# Patient Record
Sex: Female | Born: 1945 | Race: White | Hispanic: No | Marital: Married | Smoking: Former smoker
Health system: Southern US, Community
[De-identification: ages and names within clinical notes are randomized; demographics above are authoritative.]

## PROBLEM LIST (undated history)

## (undated) DIAGNOSIS — R0602 Shortness of breath: Secondary | ICD-10-CM

## (undated) DIAGNOSIS — R42 Dizziness and giddiness: Secondary | ICD-10-CM

## (undated) DIAGNOSIS — H269 Unspecified cataract: Secondary | ICD-10-CM

## (undated) DIAGNOSIS — Q251 Coarctation of aorta: Secondary | ICD-10-CM

## (undated) DIAGNOSIS — L853 Xerosis cutis: Secondary | ICD-10-CM

## (undated) DIAGNOSIS — K5900 Constipation, unspecified: Secondary | ICD-10-CM

## (undated) DIAGNOSIS — F32A Depression, unspecified: Secondary | ICD-10-CM

## (undated) DIAGNOSIS — G8929 Other chronic pain: Secondary | ICD-10-CM

## (undated) DIAGNOSIS — M549 Dorsalgia, unspecified: Secondary | ICD-10-CM

## (undated) DIAGNOSIS — F419 Anxiety disorder, unspecified: Secondary | ICD-10-CM

## (undated) DIAGNOSIS — F329 Major depressive disorder, single episode, unspecified: Secondary | ICD-10-CM

## (undated) DIAGNOSIS — R51 Headache: Secondary | ICD-10-CM

## (undated) DIAGNOSIS — K219 Gastro-esophageal reflux disease without esophagitis: Secondary | ICD-10-CM

## (undated) DIAGNOSIS — M1990 Unspecified osteoarthritis, unspecified site: Secondary | ICD-10-CM

## (undated) DIAGNOSIS — J189 Pneumonia, unspecified organism: Secondary | ICD-10-CM

## (undated) DIAGNOSIS — Z8719 Personal history of other diseases of the digestive system: Secondary | ICD-10-CM

## (undated) DIAGNOSIS — Z8709 Personal history of other diseases of the respiratory system: Secondary | ICD-10-CM

## (undated) DIAGNOSIS — G4700 Insomnia, unspecified: Secondary | ICD-10-CM

## (undated) DIAGNOSIS — R32 Unspecified urinary incontinence: Secondary | ICD-10-CM

## (undated) DIAGNOSIS — R200 Anesthesia of skin: Secondary | ICD-10-CM

## (undated) HISTORY — PX: EYE SURGERY: SHX253

## (undated) HISTORY — DX: Gastro-esophageal reflux disease without esophagitis: K21.9

## (undated) HISTORY — DX: Unspecified osteoarthritis, unspecified site: M19.90

## (undated) HISTORY — DX: Coarctation of aorta: Q25.1

## (undated) HISTORY — PX: ESOPHAGOGASTRODUODENOSCOPY: SHX1529

## (undated) HISTORY — PX: CARPAL TUNNEL RELEASE: SHX101

## (undated) HISTORY — PX: OOPHORECTOMY: SHX86

## (undated) HISTORY — PX: ABDOMINAL HYSTERECTOMY: SHX81

## (undated) HISTORY — PX: HERNIA REPAIR: SHX51

## (undated) HISTORY — PX: APPENDECTOMY: SHX54

---

## 1999-04-16 HISTORY — PX: COLONOSCOPY: SHX174

## 2011-01-02 DIAGNOSIS — R609 Edema, unspecified: Secondary | ICD-10-CM

## 2012-03-16 DIAGNOSIS — M1990 Unspecified osteoarthritis, unspecified site: Secondary | ICD-10-CM

## 2012-03-16 HISTORY — DX: Unspecified osteoarthritis, unspecified site: M19.90

## 2012-04-21 ENCOUNTER — Other Ambulatory Visit: Payer: Self-pay | Admitting: *Deleted

## 2012-04-21 DIAGNOSIS — I350 Nonrheumatic aortic (valve) stenosis: Secondary | ICD-10-CM

## 2012-05-12 ENCOUNTER — Encounter: Payer: 59 | Admitting: Vascular Surgery

## 2012-05-12 ENCOUNTER — Encounter: Payer: Self-pay | Admitting: Vascular Surgery

## 2012-05-20 ENCOUNTER — Encounter: Payer: Self-pay | Admitting: Vascular Surgery

## 2012-05-21 ENCOUNTER — Ambulatory Visit (INDEPENDENT_AMBULATORY_CARE_PROVIDER_SITE_OTHER): Payer: 59 | Admitting: Vascular Surgery

## 2012-05-21 ENCOUNTER — Encounter (INDEPENDENT_AMBULATORY_CARE_PROVIDER_SITE_OTHER): Payer: 59 | Admitting: *Deleted

## 2012-05-21 ENCOUNTER — Encounter: Payer: Self-pay | Admitting: Vascular Surgery

## 2012-05-21 ENCOUNTER — Other Ambulatory Visit: Payer: 59

## 2012-05-21 VITALS — BP 121/68 | HR 79 | Ht 65.0 in | Wt 129.7 lb

## 2012-05-21 DIAGNOSIS — Q251 Coarctation of aorta: Secondary | ICD-10-CM

## 2012-05-21 DIAGNOSIS — Z01810 Encounter for preprocedural cardiovascular examination: Secondary | ICD-10-CM

## 2012-05-21 DIAGNOSIS — I350 Nonrheumatic aortic (valve) stenosis: Secondary | ICD-10-CM

## 2012-05-21 NOTE — Progress Notes (Signed)
[facility] [place] HISTORY AND PHYSICAL  [facility] Consultation  Referring physician: Rodney Mortensen MD History of Present Illness:  Patient is a 67 y.o. year old female who presents for evaluation of lower extremity claudication. She was seen by Dr. Mortensen recently for evaluation of leg weakness. She was found on MRI scan to have evidence of aortic stenosis in the abdominal portion. The patient states she has been having difficulty walking more than 50-100 feet for the last 6 months. She denies rest pain. She denies numbness or tingling in her feet. She has no history of hypertension elevated cholesterol or diabetes. She does smoke approximately 1/2 to one pack of cigarettes per day. Greater than 3 minutes they were spent regarding smoking cessation counseling. She is interested in quitting. She has had some dysphagia but states that she thinks this is related to her hiatal hernia and reflux. She denies food fear. She denies weight loss. She gets chest pain with exertion but again blames this on her reflux. She becomes short of breath climbing more than 2 flights of stairs. She states she did have a stress test in the past at [facility]. This is not available for review today. The MRI report is available the images are not available for viewing. She also complains of recent numbness and tingling in the digits of her left hand 69 and 5. This is intermittent in nature..  Other medical problems include reflux COPD and arthritis all of which are currently stable.  Past Medical History  Diagnosis Date  . Abdominal aortic stenosis   . Esophageal reflux   . COPD (chronic obstructive pulmonary disease)   . Arthritis 03/16/12    Minimal Degenerative Disc Disease  L3-4 and L 4-5    Past Surgical History  Procedure Date  . Abdominal hysterectomy   . Hernia repair   . Eye surgery      Social History History  Substance Use Topics  .  Smoking status: Current Every Day Smoker -- 0.5 packs/day    Types: Cigarettes  . Smokeless tobacco: Never Used     Comment: pt states she is using E-cigs   . Alcohol Use: No    Family History Family History  Problem Relation Age of Onset  . Heart attack Mother   . Diabetes Mother   . Heart disease Mother   . Hyperlipidemia Mother   . Hypertension Mother   . Lung disease Father     Black Lung  . Heart disease Father     before age 60  . Hyperlipidemia Father   . Hypertension Father   . Heart attack Father   . Diabetes Sister   . Heart disease Sister     before age 60  . Hyperlipidemia Sister   . Hypertension Sister   . Heart attack Sister   . Diabetes Brother   . Heart disease Brother     before age 60  . Hypertension Brother   . Heart attack Brother     Allergies  Allergies  Allergen Reactions  . Sulfa Antibiotics   . Sulfacet-Sulfur Wash &Cleanser   . Sulfacetamide Sodium-Sulfur   . Sulfacetamide-Prednisolone      Current Outpatient Prescriptions  Medication Sig Dispense Refill  . esomeprazole (NEXIUM) 40 MG capsule Take 40 mg by mouth daily before breakfast.      . HYDROcodone-acetaminophen (LORTAB 5) 5-500 MG per tablet Take 1 tablet by mouth every 6 (six) hours as needed.          ROS:   General:  No weight loss, Fever, chills  HEENT: No recent headaches, no nasal bleeding, no visual changes, no sore throat  Neurologic: No dizziness, blackouts, seizures. No recent symptoms of stroke or mini- stroke. No recent episodes of slurred speech, or temporary blindness.  Cardiac: + recent episodes of chest pain/pressure, no shortness of breath at rest.  + shortness of breath with exertion.  Denies history of atrial fibrillation or irregular heartbeat  Vascular: No history of rest pain in feet.  + history of claudication.  No history of non-healing ulcer, No history of DVT   Pulmonary: No home oxygen, no productive cough, no hemoptysis,  No asthma or  wheezing  Musculoskeletal:  [ ] Arthritis, [ x] Low back pain,  [ ] Joint pain  Hematologic:No history of hypercoagulable state.  No history of easy bleeding.  No history of anemia  Gastrointestinal: No hematochezia or melena,  + gastroesophageal reflux, + trouble swallowing  Urinary: [ ] chronic Kidney disease, [ ] on HD - [ ] MWF or [ ] TTHS, [ ] Burning with urination, [ ] Frequent urination, [ ] Difficulty urinating;   Skin: No rashes  Psychological: No history of anxiety,  No history of depression   Physical Examination  Filed Vitals:   05/21/12 0958  BP: 121/68  Pulse: 79  Height: 5' 5" (1.651 m)  Weight: 129 lb 11.2 oz (58.832 kg)  SpO2: 100%    Body mass index is 21.58 kg/(m^2).  General:  Alert and oriented, no acute distress HEENT: Normal Neck: No bruit or JVD Pulmonary: Clear to auscultation bilaterally Cardiac: Regular Rate and Rhythm without murmur Abdomen: Soft, non-tender, non-distended, no mass, lower midline scar, mild right upper quadrant tenderness on palpation Skin: No rash Extremity Pulses:  2+ radial, brachial, absent femoral, dorsalis pedis, posterior tibial pulses bilaterally Musculoskeletal: No deformity or edema  Neurologic: Upper and lower extremity motor 5/5 and symmetric  DATA: The patient had bilateral ABIs today which are reviewed and interpreted. ABI on the right was 0.54 left was 0.522 pressure 50 on the left 70 on the right waveforms were monophasic from the femoral to dorsalis pedis   ASSESSMENT:  Aortoiliac occlusive disease. Disabling claudication.  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 Possible coronary artery disease.   PLAN:1.  The patient was advised to quit smoking. #2 we have scheduled the patient for cardiology evaluation at [facility] possible stress test to evaluate her chest pain as well as the possibility that she may require aortobifemoral bypass grafting #3 CT angiogram of the abdomen and pelvis with lower  extremity runoff to further define her mesenteric circulation her aortoiliac circulation in her lower extremity runoff #4 she will followup in 2 weeks to review the results of the above testing #5 she was placed on one aspirin daily today  Charles Fields, MD [facility] [place] Office: [phone] Pager: [phone]  

## 2012-05-22 NOTE — Addendum Note (Signed)
Addended by: Sharee Pimple on: 05/22/2012 09:45 AM   Modules accepted: Orders

## 2012-05-25 ENCOUNTER — Encounter: Payer: Self-pay | Admitting: Internal Medicine

## 2012-05-25 ENCOUNTER — Ambulatory Visit (INDEPENDENT_AMBULATORY_CARE_PROVIDER_SITE_OTHER): Payer: 59 | Admitting: Internal Medicine

## 2012-05-25 VITALS — BP 138/66 | HR 76 | Ht 65.0 in | Wt 128.0 lb

## 2012-05-25 DIAGNOSIS — R079 Chest pain, unspecified: Secondary | ICD-10-CM

## 2012-05-25 NOTE — Patient Instructions (Addendum)
Your physician has requested that you have a lexiscan myoview. For further information please visit [email or web address] Please follow instruction sheet, as given.  Pt needs to sign records release to obtain records from [place], [facility].

## 2012-05-25 NOTE — Progress Notes (Signed)
HPI Patient is a 67 yo who is being evaluated for vascular surgery  Presents for preop eval.  Patient has had problem swallowing for a couple years Feels food gets stuck  Pain and SOB after eating    No pain in chest at other times Occasional PND if eats late  Uses inhaler. NOt too active because of pain in legs.   Does gardening otherwise no organized activity   Allergies  Allergen Reactions  . Sulfa Antibiotics   . Sulfacet-Sulfur Wash &Cleanser   . Sulfacetamide Sodium-Sulfur   . Sulfacetamide-Prednisolone     Current Outpatient Prescriptions  Medication Sig Dispense Refill  . esomeprazole (NEXIUM) 40 MG capsule Take 40 mg by mouth daily before breakfast.      . HYDROcodone-acetaminophen (LORTAB 5) 5-500 MG per tablet Take 1 tablet by mouth every 6 (six) hours as needed.       No current facility-administered medications for this visit.    Past Medical History  Diagnosis Date  . Abdominal aortic stenosis   . Esophageal reflux   . COPD (chronic obstructive pulmonary disease)   . Arthritis 03/16/12    Minimal Degenerative Disc Disease  L3-4 and L 4-5    Past Surgical History  Procedure Laterality Date  . Abdominal hysterectomy    . Hernia repair    . Eye surgery      Family History  Problem Relation Age of Onset  . Heart attack Mother   . Diabetes Mother   . Heart disease Mother   . Hyperlipidemia Mother   . Hypertension Mother   . Lung disease Father     Black Lung  . Heart disease Father     before age 64  . Hyperlipidemia Father   . Hypertension Father   . Heart attack Father   . Diabetes Sister   . Heart disease Sister     before age 32  . Hyperlipidemia Sister   . Hypertension Sister   . Heart attack Sister   . Diabetes Brother   . Heart disease Brother     before age 8  . Hypertension Brother   . Heart attack Brother     History   Social History  . Marital Status: Married    Spouse Name: N/A    Number of Children: N/A  . Years of  Education: N/A   Occupational History  . Not on file.   Social History Main Topics  . Smoking status: Current Every Day Smoker -- 0.50 packs/day    Types: Cigarettes  . Smokeless tobacco: Never Used     Comment: pt states she is using E-cigs   . Alcohol Use: No  . Drug Use: No  . Sexually Active: Not on file   Other Topics Concern  . Not on file   Social History Narrative  . No narrative on file    Review of Systems:  All systems reviewed.  They are negative to the above problem except as previously stated.  Vital Signs: BP 138/66  Pulse 76  Ht 5\' 5"  (1.651 m)  Wt 128 lb (58.06 kg)  BMI 21.3 kg/m2  SpO2 96%  Physical Exam [place] is in NAD HEENT:  Normocephalic, atraumatic. EOMI, PERRLA.  Neck: JVP is normal.  No bruits.  Lungs: clear to auscultation. No rales no wheezes.  Heart: Regular rate and rhythm. Normal S1, S2. No S3.   No significant murmurs. PMI not displaced.  Abdomen:  Supple, nontender. Normal bowel sounds. No masses. No  hepatomegaly.  Extremities:  No lower extremity edema.  Musculoskeletal :moving all extremities.  Neuro:   alert and oriented x3.  CN II-XII grossly intact.  EKG  SR 71 bpm.    Assessment and Plan:  1.  Preop eval.   Patient with PVOD  Comes for preop evaluation.  No CP but does have some SOB  Activity is limited.  I would recomm  lexiscan myoview to evaluate  2.  GI  Will get records of swallowing eval from [place].  3.  Lipids  Needs to be on a statin.  Will get fasting lipids.

## 2012-06-01 ENCOUNTER — Other Ambulatory Visit: Payer: Self-pay | Admitting: *Deleted

## 2012-06-01 DIAGNOSIS — E785 Hyperlipidemia, unspecified: Secondary | ICD-10-CM

## 2012-06-08 ENCOUNTER — Ambulatory Visit (HOSPITAL_COMMUNITY): Payer: Medicare PPO | Attending: Cardiovascular Disease | Admitting: Radiology

## 2012-06-08 ENCOUNTER — Other Ambulatory Visit (INDEPENDENT_AMBULATORY_CARE_PROVIDER_SITE_OTHER): Payer: 59

## 2012-06-08 VITALS — BP 121/39 | HR 77 | Ht 65.0 in | Wt 127.0 lb

## 2012-06-08 DIAGNOSIS — R0609 Other forms of dyspnea: Secondary | ICD-10-CM | POA: Insufficient documentation

## 2012-06-08 DIAGNOSIS — R079 Chest pain, unspecified: Secondary | ICD-10-CM

## 2012-06-08 DIAGNOSIS — R0789 Other chest pain: Secondary | ICD-10-CM | POA: Insufficient documentation

## 2012-06-08 DIAGNOSIS — E785 Hyperlipidemia, unspecified: Secondary | ICD-10-CM

## 2012-06-08 DIAGNOSIS — R209 Unspecified disturbances of skin sensation: Secondary | ICD-10-CM | POA: Insufficient documentation

## 2012-06-08 DIAGNOSIS — R42 Dizziness and giddiness: Secondary | ICD-10-CM | POA: Insufficient documentation

## 2012-06-08 DIAGNOSIS — Z8249 Family history of ischemic heart disease and other diseases of the circulatory system: Secondary | ICD-10-CM | POA: Insufficient documentation

## 2012-06-08 DIAGNOSIS — I739 Peripheral vascular disease, unspecified: Secondary | ICD-10-CM | POA: Insufficient documentation

## 2012-06-08 DIAGNOSIS — R0602 Shortness of breath: Secondary | ICD-10-CM

## 2012-06-08 DIAGNOSIS — R0989 Other specified symptoms and signs involving the circulatory and respiratory systems: Secondary | ICD-10-CM | POA: Insufficient documentation

## 2012-06-08 DIAGNOSIS — F17200 Nicotine dependence, unspecified, uncomplicated: Secondary | ICD-10-CM | POA: Insufficient documentation

## 2012-06-08 DIAGNOSIS — Z01810 Encounter for preprocedural cardiovascular examination: Secondary | ICD-10-CM

## 2012-06-08 LAB — LIPID PANEL
Cholesterol: 240 mg/dL — ABNORMAL HIGH (ref 0–200)
Total CHOL/HDL Ratio: 10

## 2012-06-08 LAB — LDL CHOLESTEROL, DIRECT: Direct LDL: 101.1 mg/dL

## 2012-06-08 MED ORDER — TECHNETIUM TC 99M SESTAMIBI GENERIC - CARDIOLITE
33.0000 | Freq: Once | INTRAVENOUS | Status: AC | PRN
  Administered 2012-06-08: 33 via INTRAVENOUS

## 2012-06-08 MED ORDER — REGADENOSON 0.4 MG/5ML IV SOLN
0.4000 mg | Freq: Once | INTRAVENOUS | Status: AC
  Administered 2012-06-08: 0.4 mg via INTRAVENOUS

## 2012-06-08 MED ORDER — TECHNETIUM TC 99M SESTAMIBI GENERIC - CARDIOLITE
11.0000 | Freq: Once | INTRAVENOUS | Status: AC | PRN
  Administered 2012-06-08: 11 via INTRAVENOUS

## 2012-06-08 NOTE — Progress Notes (Signed)
[facility] SITE 3 NUCLEAR MED [address] [place], [place] [address] [phone]    Cardiology Nuclear Med Study  Lindsay [name] is a 67 y.o. female     MRN : [id number]     DOB: 1946-01-23  Procedure Date: 06/08/2012  Nuclear Med Background Indication for Stress Test:  Evaluation for Ischemia and Pending Clearance for Vascular Aorto-Bifemoral Bypass by Dr Fabienne Bruns History:  '13 GXT:OK per patient Cardiac Risk Factors: Claudication, Family History - CAD, Lipids, PVD and Smoker  Symptoms:  Chest Pressure/Chest Tightness with and without Exertion (last episode of chest discomfort is now, 2/10), Dizziness and DOE.  She also c/o (L) Hand Numbness with resting BP readings of (L) 121/39 and (R) 112/50.   Nuclear Pre-Procedure Caffeine/Decaff Intake:  None > 12 hrs NPO After: 9:00pm   Lungs:  Clear. O2 Sat: 95% on room air. IV 0.9% NS with Angio Cath:  22g  IV Site: R Antecubital x 1, tolerated well IV Started by:  Irean Hong, RN  Chest Size (in):  36 Cup Size: C  Height: 5\' 5"  (1.651 m)  Weight:  127 lb (57.607 kg)  BMI:  Body mass index is 21.13 kg/(m^2). Tech Comments:  n/a    Nuclear Med Study 1 or 2 day study: 1 day  Stress Test Type:  Lexiscan  Reading MD: Charlton Haws, MD  Order Authorizing Provider:  Dietrich Pates, MD  Resting Radionuclide: Technetium 69m Sestamibi  Resting Radionuclide Dose: 11.0 mCi   Stress Radionuclide:  Technetium 65m Sestamibi  Stress Radionuclide Dose: 33.0 mCi           Stress Protocol Rest HR: 77 Stress HR: 112  Rest BP: (L) arm 120/39  (R) 112/50 Stress BP: 135/49  Exercise Time (min): n/a METS: n/a   Predicted Max HR: 152 bpm % Max HR: 73.68 bpm Rate Pressure Product: 41324   Dose of Adenosine (mg):  n/a Dose of Lexiscan: 0.4 mg  Dose of Atropine (mg): n/a Dose of Dobutamine: n/a mcg/kg/min (at max HR)  Stress Test Technologist: Smiley Houseman, CMA-N  Nuclear Technologist:  Domenic Polite, CNMT     Rest  Procedure:  Myocardial perfusion imaging was performed at rest 45 minutes following the intravenous administration of Technetium 29m Sestamibi.  Rest ECG: NSR - Normal EKG  Stress Procedure:  The patient received IV Lexiscan 0.4 mg over 15-seconds.  Technetium 24m Sestamibi injected at 30-seconds.  She did c/o chest pain, 8/10,  with Lexiscan.  Quantitative spect images were obtained after a 45 minute delay.  Stress ECG: No significant change from baseline ECG  QPS Raw Data Images:  Normal; no motion artifact; normal heart/lung ratio. Stress Images:  Normal homogeneous uptake in all areas of the myocardium. Rest Images:  Normal homogeneous uptake in all areas of the myocardium. Subtraction (SDS):  Normal Transient Ischemic Dilatation (Normal <1.22):  1.26 Lung/Heart Ratio (Normal <0.45):  0.33  Quantitative Gated Spect Images QGS EDV:  33 ml QGS ESV:  04 ml  Impression Exercise Capacity:  Lexiscan with low level exercise. BP Response:  Normal blood pressure response. Clinical Symptoms:  Atypical chest pain. ECG Impression:  No significant ST segment change suggestive of ischemia. Comparison with Prior Nuclear Study: No previous nuclear study performed  Overall Impression:  Normal stress nuclear study.  LV Ejection Fraction: 87%.  LV Wall Motion:  NL LV Function; NL Wall Motion   Charlton Haws

## 2012-06-10 ENCOUNTER — Encounter: Payer: Self-pay | Admitting: Vascular Surgery

## 2012-06-11 ENCOUNTER — Other Ambulatory Visit: Payer: Self-pay | Admitting: *Deleted

## 2012-06-11 ENCOUNTER — Encounter (HOSPITAL_COMMUNITY): Payer: Self-pay | Admitting: Pharmacy Technician

## 2012-06-11 ENCOUNTER — Encounter: Payer: Self-pay | Admitting: Vascular Surgery

## 2012-06-11 ENCOUNTER — Ambulatory Visit (INDEPENDENT_AMBULATORY_CARE_PROVIDER_SITE_OTHER): Payer: 59 | Admitting: Vascular Surgery

## 2012-06-11 ENCOUNTER — Encounter: Payer: Self-pay | Admitting: *Deleted

## 2012-06-11 ENCOUNTER — Ambulatory Visit
Admission: RE | Admit: 2012-06-11 | Discharge: 2012-06-11 | Disposition: A | Discharge status: Home / Self Care | Payer: 59 | Source: Ambulatory Visit | Attending: Vascular Surgery | Admitting: Vascular Surgery

## 2012-06-11 VITALS — BP 125/65 | HR 84 | Ht 65.0 in | Wt 129.4 lb

## 2012-06-11 DIAGNOSIS — Q251 Coarctation of aorta: Secondary | ICD-10-CM

## 2012-06-11 DIAGNOSIS — Z01810 Encounter for preprocedural cardiovascular examination: Secondary | ICD-10-CM

## 2012-06-11 MED ORDER — IOHEXOL 350 MG/ML SOLN
150.0000 mL | Freq: Once | INTRAVENOUS | Status: AC | PRN
  Administered 2012-06-11: 150 mL via INTRAVENOUS

## 2012-06-11 NOTE — Progress Notes (Signed)
[facility] [place] HISTORY AND PHYSICAL   History of Present Illness:  Patient is a 67 y.o. year old female who presents for evaluation of lower extremity claudication.  She was seen a few weeks ago and noted to have a clinical exam significant for aortoiliac occlusive disease. She was sent for a CT angiogram of the abdomen and pelvis. This was also done with runoff. She returns today for further followup. She has also seen Dr. Huston Foley and Dr. Eden Emms for cardiology evaluation. Her stress test showed no ischemia. Unfortunately she continues to smoke. Smoking cessation was again emphasized today. She continues to have bilateral lower extremity claudication symptoms at short walking distance.  Past Medical History  Diagnosis Date  . Abdominal aortic stenosis   . Esophageal reflux   . COPD (chronic obstructive pulmonary disease)   . Arthritis 03/16/12    Minimal Degenerative Disc Disease  L3-4 and L 4-5    Past Surgical History  Procedure Laterality Date  . Abdominal hysterectomy    . Hernia repair    . Eye surgery       Social History History  Substance Use Topics  . Smoking status: Current Every Day Smoker -- 0.50 packs/day    Types: Cigarettes  . Smokeless tobacco: Never Used     Comment: pt states she is using E-cigs   . Alcohol Use: No    Family History Family History  Problem Relation Age of Onset  . Heart attack Mother   . Diabetes Mother   . Heart disease Mother   . Hyperlipidemia Mother   . Hypertension Mother   . Lung disease Father     Black Lung  . Heart disease Father     before age 70  . Hyperlipidemia Father   . Hypertension Father   . Heart attack Father   . Diabetes Sister   . Heart disease Sister     before age 56  . Hyperlipidemia Sister   . Hypertension Sister   . Heart attack Sister   . Diabetes Brother   . Heart disease Brother     before age 30  . Hypertension Brother   . Heart attack Brother      Allergies  Allergies  Allergen Reactions  . Sulfa Antibiotics Anaphylaxis    Can not take ANYTHING with SULFA.  Maude Leriche Wash &Cleanser Anaphylaxis     Current Outpatient Prescriptions  Medication Sig Dispense Refill  . albuterol (PROVENTIL HFA;VENTOLIN HFA) 108 (90 BASE) MCG/ACT inhaler Inhale 2 puffs into the lungs every 6 (six) hours as needed for wheezing. For wheezing      . aspirin EC 81 MG tablet Take 81 mg by mouth daily.      Marland Kitchen esomeprazole (NEXIUM) 40 MG capsule Take 40 mg by mouth daily before breakfast.      . HYDROcodone-acetaminophen (NORCO/VICODIN) 5-325 MG per tablet Take 1 tablet by mouth every 6 (six) hours as needed for pain. For pain       No current facility-administered medications for this visit.    ROS:   General:  No weight loss, Fever, chills   Cardiac: No recent episodes of chest pain/pressure, no shortness of breath at rest.  +shortness of breath with exertion.  Denies history of atrial fibrillation or irregular heartbeat  Vascular: No history of rest pain in feet.  + history of claudication.  No history of non-healing ulcer, No history of DVT   Pulmonary: No home oxygen, no productive cough,  no hemoptysis,  No asthma or wheezing   Physical Examinatio Body mass index is 21.53 kg/(m^2).  Filed Vitals:   06/11/12 1325  BP: 125/65  Pulse: 84  Height: 5\' 5"  (1.651 m)  Weight: 129 lb 6.4 oz (58.695 kg)  SpO2: 96%    General:  Alert and oriented, no acute distress HEENT: Normal Neck: No bruit or JVD Pulmonary: Clear to auscultation bilaterally Cardiac: Regular Rate and Rhythm without murmur Abdomen: Soft, non-tender, non-distended, no mass, no scars Skin: No rash Extremity Pulses:  2+ radial, brachial, absent femoral, dorsalis pedis, posterior tibial pulses bilaterally Musculoskeletal: No deformity or edema  Neurologic: Upper and lower extremity motor 5/5 and symmetric  DATA: CT angiogram the abdomen and pelvis is reviewed  today. This shows a significant plaque burden in the infrarenal abdominal aorta. The infrarenal abdominal aorta is up to 70% stenosis. The common external and internal iliac arteries are patent but small. There approximate 5 mm. There is three-vessel runoff with patent superficial femoral and popliteal arteries bilaterally. There is also some thrombus lining the. Visceral aorta. However, there is no compromise of the celiac or superior mesenteric arteries. There is an 8 cm cystic ovarian mass.   ASSESSMENT: The patient needs aortic reconstruction with either aortic endarterectomy or possibly replacement of her  infrarenal aorta either with aortoiliac or aortobifemoral bypass. However, in light of the ovarian mass I have also consulted Dr Chiquita Loth with Gynecology.   PLAN:  Pelvic ultrasound tomorrow per Dr. Audie Box. Aortic reconstruction with possible combined oophorectomy depending on Dr. Reynold Bowen findings.  Tentatively her aortic reconstruction would be Wednesday, March 5.  All these findings were communicated with the patient today by phone.  Fabienne Bruns, MD [facility] [place] Office: [phone] Pager: [phone]

## 2012-06-12 ENCOUNTER — Encounter: Payer: Self-pay | Admitting: Gynecology

## 2012-06-12 ENCOUNTER — Ambulatory Visit (INDEPENDENT_AMBULATORY_CARE_PROVIDER_SITE_OTHER): Payer: 59 | Admitting: Gynecology

## 2012-06-12 ENCOUNTER — Ambulatory Visit (INDEPENDENT_AMBULATORY_CARE_PROVIDER_SITE_OTHER): Payer: 59

## 2012-06-12 VITALS — BP 120/76 | Ht 63.0 in | Wt 128.0 lb

## 2012-06-12 DIAGNOSIS — R1032 Left lower quadrant pain: Secondary | ICD-10-CM

## 2012-06-12 DIAGNOSIS — D4959 Neoplasm of unspecified behavior of other genitourinary organ: Secondary | ICD-10-CM

## 2012-06-12 DIAGNOSIS — N83209 Unspecified ovarian cyst, unspecified side: Secondary | ICD-10-CM

## 2012-06-12 LAB — CA 125: CA 125: 23.7 U/mL (ref 0.0–30.2)

## 2012-06-12 NOTE — Progress Notes (Addendum)
Patient presents as a consult from Dr. Fabienne Bruns.  She is scheduled for aortic reconstruction next week due to intermittent claudication where preoperative CT scan yesterday showed an 8 cm cystic mass in the left adnexa. Patient has a history of TAH RSO due to bleeding and right ovarian cyst a number of years ago. She does note some left-sided pain over the last several weeks but no other symptoms. Has not seen a gynecologist in a number of years.  Exam with Kim Asst. Abdomen soft nontender without masses guarding rebound organomegaly. Pelvic external BUS vagina with atrophic changes. Cuff well supported. Bimanual difficult due to voluntary guarding with no gross palpable masses. Rectovaginal exam is normal.  Ultrasound today shows right adnexa negative. Left adnexa with echo free thin-walled avascular cyst measuring 81 x 54 x 61 mm. No cul-de-sac/ascites fluid noted.  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 CT shows no evidence of adenopathy or ascites. We'll check baseline CA 125. I suspect that this is a benign cyst that she may have had for some time. Discussed with patient, I think if she was not having surgery already options for expectant management with reultrasound in several months reasonable. As she is having a large midline incision for her vascular surgery certainly reasonable to remove this cyst at the same time as I do not think this would add significant risk to her surgery. Options for gynecologic oncology referral discussed and offered, but given the 6.5 cm size, simple avascular and if she does have a normal CA 125 highly unlikely that it is cancer. If it does turned out to be cancer with final pathology the possibilities for further treatment and the issue of suboptimal staging discussed. We'll await CA 125 results and then talk to Dr. Darrick Penna about coordinating the surgery.

## 2012-06-12 NOTE — Patient Instructions (Signed)
Office we'll followup with you in reference to the blood work and surgery.

## 2012-06-15 ENCOUNTER — Telehealth: Payer: Self-pay | Admitting: Gynecology

## 2012-06-15 NOTE — Telephone Encounter (Signed)
Called patient in followup of her CA 125 which returned normal at 78.  I reviewed the situation again with her. She has a thin-walled avascular 6.5 cm mean left ovarian cyst discovered incidentally while doing CT scan for her vascular workup. If she was not having surgery already I would have recommended followup with reultrasound in several months as I suspect that this is a benign process. As she is having surgery with a large incision I think it would be prudent to remove it now instead of waiting and then discovering that it's enlarging months from now and requiring reoperation. She understands that I cannot guarantee that this is not ovarian cancer and the options for gynecologic oncology involvement and possibly having them do the surgery in conjunction with Dr. Darrick Penna was also discussed and offered. Given the timing this would require her to cancel her vascular surgery Wednesday to allow for them to see her. If I proceed with surgery and indeed it turns out to be an ovarian cancer I again reviewed the issues that she would not have complete staging that she would require followup with the oncologist and possible/probable treatment afterwards. The patient wants me to proceed with surgery accepting the possibility that it could be a cancer requiring referral to a gynecologic oncologist afterwards.  She understands that my portion of the surgery is small compared to the vascular portion. Dr. Darrick Penna will be in charge of her postoperative care and she will remain on his service. I will followup with her with the pathology results but would not be involved with the day-to-day postoperative care and she understands and accepts this.  I counseled her as to the risks of surgery in general noting Dr. Darrick Penna has already reviewed the risks.  I discussed the risks of infection, prolonged antibiotics, abscess formation requiring reoperation, hemorrhage necessitating transfusion and the risks of transfusion to include  transfusion reaction, hepatitis, HIV, Mad Cow disease and other unknown entities. Incisional complications requiring opening and draining of incisions, closure by secondary intention and long-term risks to include hernia formation/cosmetics was reviewed. The risk of damage to internal organs including bowel, bladder, ureters, vessels and nerves requiring major exploratory reparative surgeries and future reparative surgeries including bowel resection, bladder repair, ureteral damage repair and ostomy formation was also discussed with her. The patient's questions were answered to her satisfaction and she is ready to proceed with surgery from the gynecologic standpoint.  I subsequently discussed this with Dr. Darrick Penna on the phone and he will proceed with surgery as planned. He will open the patient and alert me when ready and he and I will perform the oophorectomy. I will remain available if there are questions postoperatively and will followup with the patient in reference to the ovarian pathology but will not be involved with her day to day care and he agrees and is comfortable with this.

## 2012-06-15 NOTE — Pre-Procedure Instructions (Signed)
Lindsay [name]  06/15/2012   Your procedure is scheduled on:  Wed, Mar 5 @ 8:30 AM  Report to [facility] at 6:30 AM.  Call this number if you have problems the morning of surgery: [phone]   Remember:   Do not eat food or drink liquids after midnight.   Take these medicines the morning of surgery with A SIP OF WATER: Albuterol<Bring Your Inhaler With You>,Pain Pill(if needed),and Esomeprazole(Nexium)   Do not wear jewelry, make-up or nail polish.  Do not wear lotions, powders, or perfumes. You may wear deodorant.  Do not shave 48 hours prior to surgery.   Do not bring valuables to the hospital.  Contacts, dentures or bridgework may not be worn into surgery.  Leave suitcase in the car. After surgery it may be brought to your room.  For patients admitted to the hospital, checkout time is 11:00 AM the day of  discharge.   Patients discharged the day of surgery will not be allowed to drive  home.    Special Instructions: Shower using CHG 2 nights before surgery and the night before surgery.  If you shower the day of surgery use CHG.  Use special wash - you have one bottle of CHG for all showers.  You should use approximately 1/3 of the bottle for each shower.   Please read over the following fact sheets that you were given: Pain Booklet, Coughing and Deep Breathing, Blood Transfusion Information, MRSA Information and Surgical Site Infection Prevention

## 2012-06-16 ENCOUNTER — Encounter (HOSPITAL_COMMUNITY)
Admission: RE | Admit: 2012-06-16 | Discharge: 2012-06-16 | Disposition: A | Discharge status: Home / Self Care | Payer: Medicare PPO | Source: Ambulatory Visit | Attending: Anesthesiology | Admitting: Anesthesiology

## 2012-06-16 ENCOUNTER — Encounter (HOSPITAL_COMMUNITY): Payer: Self-pay

## 2012-06-16 ENCOUNTER — Encounter (HOSPITAL_COMMUNITY)
Admission: RE | Admit: 2012-06-16 | Discharge: 2012-06-16 | Disposition: A | Discharge status: Home / Self Care | Payer: Medicare PPO | Source: Ambulatory Visit | Attending: Vascular Surgery | Admitting: Vascular Surgery

## 2012-06-16 HISTORY — DX: Constipation, unspecified: K59.00

## 2012-06-16 HISTORY — DX: Anesthesia of skin: R20.0

## 2012-06-16 HISTORY — DX: Pneumonia, unspecified organism: J18.9

## 2012-06-16 HISTORY — DX: Dorsalgia, unspecified: M54.9

## 2012-06-16 HISTORY — DX: Headache: R51

## 2012-06-16 HISTORY — DX: Shortness of breath: R06.02

## 2012-06-16 HISTORY — DX: Anxiety disorder, unspecified: F41.9

## 2012-06-16 HISTORY — DX: Personal history of other diseases of the digestive system: Z87.19

## 2012-06-16 HISTORY — DX: Insomnia, unspecified: G47.00

## 2012-06-16 HISTORY — DX: Xerosis cutis: L85.3

## 2012-06-16 HISTORY — DX: Unspecified urinary incontinence: R32

## 2012-06-16 HISTORY — DX: Depression, unspecified: F32.A

## 2012-06-16 HISTORY — DX: Dizziness and giddiness: R42

## 2012-06-16 HISTORY — DX: Personal history of other diseases of the respiratory system: Z87.09

## 2012-06-16 HISTORY — DX: Unspecified cataract: H26.9

## 2012-06-16 HISTORY — DX: Major depressive disorder, single episode, unspecified: F32.9

## 2012-06-16 HISTORY — DX: Other chronic pain: G89.29

## 2012-06-16 LAB — PROTIME-INR
INR: 0.85 (ref 0.00–1.49)
Prothrombin Time: 11.6 seconds (ref 11.6–15.2)

## 2012-06-16 LAB — BLOOD GAS, ARTERIAL
Acid-base deficit: 2.6 mmol/L — ABNORMAL HIGH (ref 0.0–2.0)
Bicarbonate: 21.7 mEq/L (ref 20.0–24.0)
TCO2: 22.9 mmol/L (ref 0–100)
pCO2 arterial: 37.8 mmHg (ref 35.0–45.0)

## 2012-06-16 LAB — COMPREHENSIVE METABOLIC PANEL
Alkaline Phosphatase: 76 U/L (ref 39–117)
BUN: 9 mg/dL (ref 6–23)
CO2: 20 mEq/L (ref 19–32)
Chloride: 104 mEq/L (ref 96–112)
Creatinine, Ser: 0.69 mg/dL (ref 0.50–1.10)
GFR calc Af Amer: >90 mL/min (ref >90)
GFR calc non Af Amer: 87 mL/min — ABNORMAL LOW (ref >90)
Glucose, Bld: 98 mg/dL (ref 70–99)
Total Bilirubin: 0.2 mg/dL — ABNORMAL LOW (ref 0.3–1.2)

## 2012-06-16 LAB — URINALYSIS, ROUTINE W REFLEX MICROSCOPIC
Hgb urine dipstick: NEGATIVE
Leukocytes, UA: NEGATIVE
Protein, ur: NEGATIVE mg/dL
Specific Gravity, Urine: 1.01 (ref 1.005–1.030)
Urobilinogen, UA: 0.2 mg/dL (ref 0.0–1.0)

## 2012-06-16 LAB — CBC
HCT: 41.9 % (ref 36.0–46.0)
Hemoglobin: 14.6 g/dL (ref 12.0–15.0)
MCV: 82.5 fL (ref 78.0–100.0)
RBC: 5.08 MIL/uL (ref 3.87–5.11)
RDW: 14.1 % (ref 11.5–15.5)
WBC: 10.7 10*3/uL — ABNORMAL HIGH (ref 4.0–10.5)

## 2012-06-16 LAB — SURGICAL PCR SCREEN: Staphylococcus aureus: NEGATIVE

## 2012-06-16 LAB — ABO/RH: ABO/RH(D): A POS

## 2012-06-16 MED ORDER — DEXTROSE 5 % IV SOLN
1.5000 g | INTRAVENOUS | Status: AC
  Administered 2012-06-17: 1.5 g via INTRAVENOUS
  Filled 2012-06-16: qty 1.5

## 2012-06-16 NOTE — Addendum Note (Signed)
Addended by: Dara Lords on: 06/16/2012 10:40 AM   Modules accepted: Orders

## 2012-06-16 NOTE — H&P (Signed)
Lindsay [name] Nov 12, 1945 [id number]   History and Physical  Chief complaint: Ovarian cyst  History of present illness: 67 y.o. G2P2002 patient presented as a consult from Dr. Fabienne Bruns. She is scheduled for aortic reconstruction due to intermittent claudication where preoperative CT scan showed an 8 cm cystic mass in the left adnexa. No evidence of lymphadenopathy or ascites.   Followup ultrasound shows a left adnexa with echo free thin-walled avascular cyst measuring 81 x 54 x 61 mm. No cul-de-sac/ascites fluid noted.  Right adnexa negative.   CA 125 23. Patient has a history of TAH RSO due to bleeding and right ovarian cyst a number of years ago. Has not seen a gynecologist in a number of years.   Past medical history,surgical history, medications, allergies, family history and social history were all reviewed and documented in the EPIC chart. ROS:  Was performed and pertinent positives and negatives are included in the history of present illness.  Exam:  Directed to her GYN complaint  General: well developed, well nourished female, no acute distress Abdomen soft nontender without masses guarding rebound organomegaly.  Pelvic external BUS vagina with atrophic changes. [name] well supported. Bimanual difficult due to voluntary guarding with no gross palpable masses. Rectovaginal exam is normal. Minimal or abnormal   Assessment/Plan:  67 y.o. G2P2002 with a thin-walled avascular 6.5 cm mean left ovarian cyst discovered incidentally while doing CT scan for her vascular workup with a negative CA 125. If she was not having surgery already I would have recommended followup with reultrasound in several months as I suspect that this is a benign process. As she is having surgery with a large incision I think it would be prudent to remove it now instead of waiting and then discovering that it's enlarging months from now and requiring reoperation. She understands that I cannot guarantee that this is  not ovarian cancer and the options for gynecologic oncology involvement and possibly having them do the surgery in conjunction with Dr. Darrick Penna was also discussed and offered.  If I proceed with surgery and indeed it turns out to be an ovarian cancer I again reviewed the issues that she would not have complete staging that she would require followup with the oncologist and possible/probable treatment afterwards. The patient wants me to proceed with surgery accepting the possibility that it could be a cancer requiring referral to a gynecologic oncologist afterwards.  She understands that my portion of the surgery is small compared to the vascular portion and that Dr. Darrick Penna will be in charge of her postoperative care and she will remain on his service. I will followup with her with the pathology results but would not be involved with the day-to-day postoperative care and she understands and accepts this.  I counseled her as to the risks of surgery in general noting Dr. Darrick Penna has already reviewed the risks. I discussed the risks of infection, prolonged antibiotics, abscess formation requiring reoperation, hemorrhage necessitating transfusion and the risks of transfusion to include transfusion reaction, hepatitis, HIV, Mad Cow disease and other unknown entities. Incisional complications requiring opening and draining of incisions, closure by secondary intention and long-term risks to include hernia formation/cosmetics was reviewed. The risk of damage to internal organs including bowel, bladder, ureters, vessels and nerves requiring major exploratory reparative surgeries and future reparative surgeries including bowel resection, bladder repair, ureteral damage repair and ostomy formation was also discussed with her. The patient's questions were answered to her satisfaction and she is ready to proceed with  surgery from the gynecologic standpoint.     Dara Lords MD, 10:42 AM  06/16/2012

## 2012-06-16 NOTE — Progress Notes (Signed)
[name] notified of CXR results and will not hault surgery;spoke with [name] at Dr.Fields office who is going to have him review results

## 2012-06-16 NOTE — Progress Notes (Signed)
Pt doesn't have a cardiologist  Stress test report in epic from 06-08-12  Denies ever having an echo or heart cath  Dr.Chan Park in [place] is Medical MD  EKG report in epic from 05-25-12  Denies CXR being done within past  yr

## 2012-06-17 ENCOUNTER — Encounter (HOSPITAL_COMMUNITY): Payer: Self-pay | Admitting: Anesthesiology

## 2012-06-17 ENCOUNTER — Inpatient Hospital Stay (HOSPITAL_COMMUNITY)
Admission: RE | Admit: 2012-06-17 | Discharge: 2012-07-01 | DRG: 237 | Disposition: A | Discharge status: Home Health | Payer: Medicare PPO | Source: Ambulatory Visit | Attending: Vascular Surgery | Admitting: Vascular Surgery

## 2012-06-17 ENCOUNTER — Encounter (HOSPITAL_COMMUNITY)
Admission: RE | Disposition: A | Discharge status: Home Health | Payer: Self-pay | Source: Ambulatory Visit | Attending: Vascular Surgery

## 2012-06-17 ENCOUNTER — Inpatient Hospital Stay (HOSPITAL_COMMUNITY): Payer: Medicare PPO | Admitting: Anesthesiology

## 2012-06-17 ENCOUNTER — Inpatient Hospital Stay (HOSPITAL_COMMUNITY): Payer: Medicare PPO

## 2012-06-17 DIAGNOSIS — M51379 Other intervertebral disc degeneration, lumbosacral region without mention of lumbar back pain or lower extremity pain: Secondary | ICD-10-CM | POA: Diagnosis present

## 2012-06-17 DIAGNOSIS — E278 Other specified disorders of adrenal gland: Secondary | ICD-10-CM

## 2012-06-17 DIAGNOSIS — F17200 Nicotine dependence, unspecified, uncomplicated: Secondary | ICD-10-CM | POA: Diagnosis present

## 2012-06-17 DIAGNOSIS — N83209 Unspecified ovarian cyst, unspecified side: Secondary | ICD-10-CM | POA: Diagnosis present

## 2012-06-17 DIAGNOSIS — K219 Gastro-esophageal reflux disease without esophagitis: Secondary | ICD-10-CM | POA: Diagnosis present

## 2012-06-17 DIAGNOSIS — Y832 Surgical operation with anastomosis, bypass or graft as the cause of abnormal reaction of the patient, or of later complication, without mention of misadventure at the time of the procedure: Secondary | ICD-10-CM | POA: Diagnosis not present

## 2012-06-17 DIAGNOSIS — K449 Diaphragmatic hernia without obstruction or gangrene: Secondary | ICD-10-CM | POA: Diagnosis present

## 2012-06-17 DIAGNOSIS — D62 Acute posthemorrhagic anemia: Secondary | ICD-10-CM | POA: Diagnosis not present

## 2012-06-17 DIAGNOSIS — M129 Arthropathy, unspecified: Secondary | ICD-10-CM | POA: Diagnosis present

## 2012-06-17 DIAGNOSIS — I700 Atherosclerosis of aorta: Principal | ICD-10-CM | POA: Diagnosis present

## 2012-06-17 DIAGNOSIS — M5137 Other intervertebral disc degeneration, lumbosacral region: Secondary | ICD-10-CM | POA: Diagnosis present

## 2012-06-17 DIAGNOSIS — R209 Unspecified disturbances of skin sensation: Secondary | ICD-10-CM | POA: Diagnosis present

## 2012-06-17 DIAGNOSIS — E872 Acidosis, unspecified: Secondary | ICD-10-CM | POA: Diagnosis not present

## 2012-06-17 DIAGNOSIS — J95821 Acute postprocedural respiratory failure: Secondary | ICD-10-CM | POA: Diagnosis not present

## 2012-06-17 DIAGNOSIS — Q2529 Other atresia of aorta: Secondary | ICD-10-CM

## 2012-06-17 DIAGNOSIS — E46 Unspecified protein-calorie malnutrition: Secondary | ICD-10-CM | POA: Diagnosis not present

## 2012-06-17 DIAGNOSIS — Q251 Coarctation of aorta: Secondary | ICD-10-CM

## 2012-06-17 DIAGNOSIS — I70219 Atherosclerosis of native arteries of extremities with intermittent claudication, unspecified extremity: Secondary | ICD-10-CM

## 2012-06-17 DIAGNOSIS — K560 Paralytic ileus: Secondary | ICD-10-CM | POA: Diagnosis not present

## 2012-06-17 DIAGNOSIS — K929 Disease of digestive system, unspecified: Secondary | ICD-10-CM | POA: Diagnosis not present

## 2012-06-17 DIAGNOSIS — F329 Major depressive disorder, single episode, unspecified: Secondary | ICD-10-CM | POA: Diagnosis present

## 2012-06-17 DIAGNOSIS — F411 Generalized anxiety disorder: Secondary | ICD-10-CM | POA: Diagnosis present

## 2012-06-17 DIAGNOSIS — J4489 Other specified chronic obstructive pulmonary disease: Secondary | ICD-10-CM | POA: Diagnosis present

## 2012-06-17 DIAGNOSIS — F3289 Other specified depressive episodes: Secondary | ICD-10-CM | POA: Diagnosis present

## 2012-06-17 DIAGNOSIS — I9589 Other hypotension: Secondary | ICD-10-CM | POA: Diagnosis not present

## 2012-06-17 DIAGNOSIS — I708 Atherosclerosis of other arteries: Secondary | ICD-10-CM | POA: Diagnosis present

## 2012-06-17 DIAGNOSIS — N9489 Other specified conditions associated with female genital organs and menstrual cycle: Secondary | ICD-10-CM

## 2012-06-17 DIAGNOSIS — R1310 Dysphagia, unspecified: Secondary | ICD-10-CM | POA: Diagnosis present

## 2012-06-17 DIAGNOSIS — Z720 Tobacco use: Secondary | ICD-10-CM

## 2012-06-17 DIAGNOSIS — R110 Nausea: Secondary | ICD-10-CM | POA: Diagnosis not present

## 2012-06-17 DIAGNOSIS — E279 Disorder of adrenal gland, unspecified: Secondary | ICD-10-CM

## 2012-06-17 DIAGNOSIS — J9600 Acute respiratory failure, unspecified whether with hypoxia or hypercapnia: Secondary | ICD-10-CM | POA: Diagnosis not present

## 2012-06-17 DIAGNOSIS — I959 Hypotension, unspecified: Secondary | ICD-10-CM

## 2012-06-17 DIAGNOSIS — J449 Chronic obstructive pulmonary disease, unspecified: Secondary | ICD-10-CM | POA: Diagnosis present

## 2012-06-17 DIAGNOSIS — K5900 Constipation, unspecified: Secondary | ICD-10-CM | POA: Diagnosis not present

## 2012-06-17 HISTORY — PX: AORTIC ENDARTERECETOMY: SHX5724

## 2012-06-17 LAB — COMPREHENSIVE METABOLIC PANEL
Alkaline Phosphatase: 32 U/L — ABNORMAL LOW (ref 39–117)
BUN: 11 mg/dL (ref 6–23)
GFR calc Af Amer: 81 mL/min — ABNORMAL LOW (ref >90)
Glucose, Bld: 229 mg/dL — ABNORMAL HIGH (ref 70–99)
Potassium: 4.2 mEq/L (ref 3.5–5.1)
Total Protein: 4.1 g/dL — ABNORMAL LOW (ref 6.0–8.3)

## 2012-06-17 LAB — POCT I-STAT 3, ART BLOOD GAS (G3+)
Bicarbonate: 18.7 mEq/L — ABNORMAL LOW (ref 20.0–24.0)
Bicarbonate: 19.1 mEq/L — ABNORMAL LOW (ref 20.0–24.0)
O2 Saturation: 96 %
Patient temperature: 98
Patient temperature: 98.2
TCO2: 20 mmol/L (ref 0–100)
TCO2: 20 mmol/L (ref 0–100)
pCO2 arterial: 35.6 mmHg (ref 35.0–45.0)
pCO2 arterial: 44.9 mmHg (ref 35.0–45.0)
pH, Arterial: 7.226 — ABNORMAL LOW (ref 7.350–7.450)
pH, Arterial: 7.335 — ABNORMAL LOW (ref 7.350–7.450)

## 2012-06-17 LAB — CBC
HCT: 39.9 % (ref 36.0–46.0)
Hemoglobin: 13.7 g/dL (ref 12.0–15.0)
MCH: 28.8 pg (ref 26.0–34.0)
MCHC: 34.3 g/dL (ref 30.0–36.0)
MCV: 84 fL (ref 78.0–100.0)
Platelets: 113 10*3/uL — ABNORMAL LOW (ref 150–400)
RBC: 4.33 MIL/uL (ref 3.87–5.11)
RDW: 14.3 % (ref 11.5–15.5)
WBC: 20.7 10*3/uL — ABNORMAL HIGH (ref 4.0–10.5)

## 2012-06-17 LAB — POCT I-STAT 4, (NA,K, GLUC, HGB,HCT)
Glucose, Bld: 166 mg/dL — ABNORMAL HIGH (ref 70–99)
Hemoglobin: 9.2 g/dL — ABNORMAL LOW (ref 12.0–15.0)
Potassium: 4.3 mEq/L (ref 3.5–5.1)
Potassium: 4.2 mEq/L (ref 3.5–5.1)
Sodium: 141 mEq/L (ref 135–145)

## 2012-06-17 LAB — POCT I-STAT 7, (LYTES, BLD GAS, ICA,H+H)
Acid-base deficit: 1 mmol/L (ref 0.0–2.0)
Acid-base deficit: 6 mmol/L — ABNORMAL HIGH (ref 0.0–2.0)
Bicarbonate: 23.3 mEq/L (ref 20.0–24.0)
Bicarbonate: 21.3 mEq/L (ref 20.0–24.0)
Bicarbonate: 24.5 mEq/L — ABNORMAL HIGH (ref 20.0–24.0)
Calcium, Ion: 0.96 mmol/L — ABNORMAL LOW (ref 1.13–1.30)
HCT: 21 % — ABNORMAL LOW (ref 36.0–46.0)
HCT: 31 % — ABNORMAL LOW (ref 36.0–46.0)
Hemoglobin: 7.1 g/dL — ABNORMAL LOW (ref 12.0–15.0)
Hemoglobin: 11.2 g/dL — ABNORMAL LOW (ref 12.0–15.0)
O2 Saturation: 100 %
O2 Saturation: 100 %
Potassium: 5.6 mEq/L — ABNORMAL HIGH (ref 3.5–5.1)
Sodium: 140 mEq/L (ref 135–145)
Sodium: 139 mEq/L (ref 135–145)
TCO2: 25 mmol/L (ref 0–100)
TCO2: 23 mmol/L (ref 0–100)
pCO2 arterial: 48.1 mmHg — ABNORMAL HIGH (ref 35.0–45.0)
pCO2 arterial: 41.6 mmHg (ref 35.0–45.0)
pH, Arterial: 7.292 — ABNORMAL LOW (ref 7.350–7.450)
pO2, Arterial: 514 mmHg — ABNORMAL HIGH (ref 80.0–100.0)
pO2, Arterial: 543 mmHg — ABNORMAL HIGH (ref 80.0–100.0)

## 2012-06-17 LAB — BLOOD GAS, ARTERIAL
Acid-base deficit: 7.5 mmol/L — ABNORMAL HIGH (ref 0.0–2.0)
Bicarbonate: 18.6 mEq/L — ABNORMAL LOW (ref 20.0–24.0)
FIO2: 1 %
O2 Saturation: 99.8 %
TCO2: 19.9 mmol/L (ref 0–100)
pO2, Arterial: 309 mmHg — ABNORMAL HIGH (ref 80.0–100.0)

## 2012-06-17 LAB — MAGNESIUM: Magnesium: 1.2 mg/dL — ABNORMAL LOW (ref 1.5–2.5)

## 2012-06-17 LAB — BASIC METABOLIC PANEL
CO2: 19 mEq/L (ref 19–32)
Chloride: 113 mEq/L — ABNORMAL HIGH (ref 96–112)
GFR calc Af Amer: >90 mL/min (ref >90)
Potassium: 5 mEq/L (ref 3.5–5.1)

## 2012-06-17 LAB — PROTIME-INR
INR: 1.49 (ref 0.00–1.49)
Prothrombin Time: 17.6 seconds — ABNORMAL HIGH (ref 11.6–15.2)

## 2012-06-17 LAB — APTT: aPTT: 38 seconds — ABNORMAL HIGH (ref 24–37)

## 2012-06-17 SURGERY — OOPHORECTOMY
Anesthesia: General | Laterality: Left

## 2012-06-17 SURGERY — CREATION, BYPASS, ARTERIAL, AORTA TO ILIAC, USING GRAFT
Anesthesia: General | Wound class: Clean

## 2012-06-17 MED ORDER — ARTIFICIAL TEARS OP OINT
TOPICAL_OINTMENT | OPHTHALMIC | Status: DC | PRN
  Administered 2012-06-17: 1 via OPHTHALMIC

## 2012-06-17 MED ORDER — LIDOCAINE HCL (CARDIAC) 20 MG/ML IV SOLN
INTRAVENOUS | Status: DC | PRN
  Administered 2012-06-17: 80 mg via INTRAVENOUS

## 2012-06-17 MED ORDER — MAGNESIUM SULFATE 40 MG/ML IJ SOLN
2.0000 g | Freq: Once | INTRAMUSCULAR | Status: AC
  Administered 2012-06-17: 2 g via INTRAVENOUS
  Filled 2012-06-17: qty 50

## 2012-06-17 MED ORDER — MORPHINE SULFATE 2 MG/ML IJ SOLN
2.0000 mg | INTRAMUSCULAR | Status: DC | PRN
  Administered 2012-06-17 (×3): 2 mg via INTRAVENOUS
  Administered 2012-06-17: 4 mg via INTRAVENOUS
  Administered 2012-06-17 (×2): 2 mg via INTRAVENOUS
  Administered 2012-06-18: 4 mg via INTRAVENOUS
  Administered 2012-06-18: 2 mg via INTRAVENOUS
  Administered 2012-06-18: 1 mg via INTRAVENOUS
  Filled 2012-06-17: qty 1
  Filled 2012-06-17: qty 2
  Filled 2012-06-17: qty 1
  Filled 2012-06-17: qty 2
  Filled 2012-06-17 (×3): qty 1
  Filled 2012-06-17: qty 2

## 2012-06-17 MED ORDER — SODIUM CHLORIDE 0.9 % IV SOLN
INTRAVENOUS | Status: DC | PRN
  Administered 2012-06-17: 11:00:00 via INTRAVENOUS

## 2012-06-17 MED ORDER — HYDROMORPHONE HCL PF 1 MG/ML IJ SOLN: 0.2500 mg | INTRAMUSCULAR | Status: DC | PRN

## 2012-06-17 MED ORDER — KCL IN DEXTROSE-NACL 20-5-0.9 MEQ/L-%-% IV SOLN
INTRAVENOUS | Status: DC
  Administered 2012-06-17: 17:00:00 via INTRAVENOUS
  Filled 2012-06-17 (×3): qty 1000

## 2012-06-17 MED ORDER — HYDRALAZINE HCL 20 MG/ML IJ SOLN
10.0000 mg | INTRAMUSCULAR | Status: DC | PRN
  Filled 2012-06-17: qty 0.5

## 2012-06-17 MED ORDER — SODIUM CHLORIDE 0.9 % IR SOLN
Status: DC | PRN
  Administered 2012-06-17: 10:00:00

## 2012-06-17 MED ORDER — ALBUTEROL SULFATE HFA 108 (90 BASE) MCG/ACT IN AERS
2.0000 | INHALATION_SPRAY | Freq: Four times a day (QID) | RESPIRATORY_TRACT | Status: DC | PRN
  Filled 2012-06-17: qty 6.7

## 2012-06-17 MED ORDER — MAGNESIUM SULFATE 40 MG/ML IJ SOLN
2.0000 g | Freq: Every day | INTRAMUSCULAR | Status: DC | PRN
  Filled 2012-06-17: qty 50

## 2012-06-17 MED ORDER — FAMOTIDINE IN NACL 20-0.9 MG/50ML-% IV SOLN
20.0000 mg | Freq: Two times a day (BID) | INTRAVENOUS | Status: DC
  Administered 2012-06-17 – 2012-06-26 (×19): 20 mg via INTRAVENOUS
  Filled 2012-06-17 (×21): qty 50

## 2012-06-17 MED ORDER — POTASSIUM CHLORIDE CRYS ER 20 MEQ PO TBCR: 20.0000 meq | EXTENDED_RELEASE_TABLET | Freq: Every day | ORAL | Status: DC | PRN

## 2012-06-17 MED ORDER — ACETAMINOPHEN 325 MG RE SUPP
325.0000 mg | RECTAL | Status: DC | PRN
  Filled 2012-06-17: qty 2

## 2012-06-17 MED ORDER — 0.9 % SODIUM CHLORIDE (POUR BTL) OPTIME
TOPICAL | Status: DC | PRN
  Administered 2012-06-17: 2000 mL
  Administered 2012-06-17: 1000 mL

## 2012-06-17 MED ORDER — SUCCINYLCHOLINE CHLORIDE 20 MG/ML IJ SOLN
INTRAMUSCULAR | Status: DC | PRN
  Administered 2012-06-17: 100 mg via INTRAVENOUS

## 2012-06-17 MED ORDER — HEPARIN SODIUM (PORCINE) 1000 UNIT/ML IJ SOLN
INTRAMUSCULAR | Status: AC
  Filled 2012-06-17: qty 1

## 2012-06-17 MED ORDER — DOPAMINE-DEXTROSE 3.2-5 MG/ML-% IV SOLN
3.0000 ug/kg/min | INTRAVENOUS | Status: DC | PRN
  Administered 2012-06-18: 5 ug/kg/min via INTRAVENOUS
  Filled 2012-06-17: qty 250

## 2012-06-17 MED ORDER — ALBUTEROL SULFATE HFA 108 (90 BASE) MCG/ACT IN AERS
8.0000 | INHALATION_SPRAY | Freq: Four times a day (QID) | RESPIRATORY_TRACT | Status: DC
  Administered 2012-06-18 (×2): 8 via RESPIRATORY_TRACT
  Filled 2012-06-17: qty 6.7

## 2012-06-17 MED ORDER — FENTANYL CITRATE 0.05 MG/ML IJ SOLN
INTRAMUSCULAR | Status: DC | PRN
  Administered 2012-06-17: 150 ug via INTRAVENOUS
  Administered 2012-06-17: 100 ug via INTRAVENOUS
  Administered 2012-06-17: 50 ug via INTRAVENOUS
  Administered 2012-06-17: 100 ug via INTRAVENOUS
  Administered 2012-06-17 (×2): 50 ug via INTRAVENOUS

## 2012-06-17 MED ORDER — MIDAZOLAM HCL 5 MG/5ML IJ SOLN
INTRAMUSCULAR | Status: DC | PRN
  Administered 2012-06-17: 2 mg via INTRAVENOUS

## 2012-06-17 MED ORDER — GLYCOPYRROLATE 0.2 MG/ML IJ SOLN
INTRAMUSCULAR | Status: DC | PRN
  Administered 2012-06-17: 0.2 mg via INTRAVENOUS
  Administered 2012-06-17: 0.6 mg via INTRAVENOUS

## 2012-06-17 MED ORDER — INSULIN ASPART 100 UNIT/ML ~~LOC~~ SOLN
0.0000 [IU] | SUBCUTANEOUS | Status: DC
  Administered 2012-06-18: 3 [IU] via SUBCUTANEOUS
  Administered 2012-06-18 (×2): 2 [IU] via SUBCUTANEOUS

## 2012-06-17 MED ORDER — ROCURONIUM BROMIDE 100 MG/10ML IV SOLN
INTRAVENOUS | Status: DC | PRN
  Administered 2012-06-17 (×2): 5 mg via INTRAVENOUS
  Administered 2012-06-17: 50 mg via INTRAVENOUS
  Administered 2012-06-17: 5 mg via INTRAVENOUS
  Administered 2012-06-17: 10 mg via INTRAVENOUS
  Administered 2012-06-17: 5 mg via INTRAVENOUS
  Administered 2012-06-17: 10 mg via INTRAVENOUS

## 2012-06-17 MED ORDER — DEXTROSE 5 % IV SOLN
1.5000 g | Freq: Two times a day (BID) | INTRAVENOUS | Status: AC
  Administered 2012-06-17 – 2012-06-18 (×2): 1.5 g via INTRAVENOUS
  Filled 2012-06-17 (×2): qty 1.5

## 2012-06-17 MED ORDER — ONDANSETRON HCL 4 MG/2ML IJ SOLN
INTRAMUSCULAR | Status: DC | PRN
  Administered 2012-06-17: 4 mg via INTRAVENOUS

## 2012-06-17 MED ORDER — ALBUMIN HUMAN 5 % IV SOLN
INTRAVENOUS | Status: DC | PRN
  Administered 2012-06-17 (×3): via INTRAVENOUS

## 2012-06-17 MED ORDER — HEPARIN SODIUM (PORCINE) 10000 UNIT/ML IJ SOLN
INTRAMUSCULAR | Status: DC | PRN
  Administered 2012-06-17: 10 mL

## 2012-06-17 MED ORDER — METOPROLOL TARTRATE 1 MG/ML IV SOLN: 2.0000 mg | INTRAVENOUS | Status: DC | PRN

## 2012-06-17 MED ORDER — NICOTINE 21 MG/24HR TD PT24
21.0000 mg | MEDICATED_PATCH | Freq: Every day | TRANSDERMAL | Status: DC
  Administered 2012-06-17: 21 mg via TRANSDERMAL
  Filled 2012-06-17 (×2): qty 1

## 2012-06-17 MED ORDER — SODIUM CHLORIDE 0.9 % IV SOLN
1.0000 g | Freq: Once | INTRAVENOUS | Status: AC
  Administered 2012-06-17: 1 g via INTRAVENOUS
  Filled 2012-06-17: qty 10

## 2012-06-17 MED ORDER — MANNITOL 20 % IV SOLN: INTRAVENOUS | Status: DC | PRN

## 2012-06-17 MED ORDER — ALBUTEROL SULFATE HFA 108 (90 BASE) MCG/ACT IN AERS: 6.0000 | INHALATION_SPRAY | RESPIRATORY_TRACT | Status: DC | PRN

## 2012-06-17 MED ORDER — LACTATED RINGERS IV SOLN
INTRAVENOUS | Status: DC | PRN
  Administered 2012-06-17: 07:00:00 via INTRAVENOUS

## 2012-06-17 MED ORDER — BISACODYL 10 MG RE SUPP
10.0000 mg | Freq: Every day | RECTAL | Status: DC | PRN
  Administered 2012-06-27 – 2012-06-30 (×2): 10 mg via RECTAL
  Filled 2012-06-17 (×3): qty 1

## 2012-06-17 MED ORDER — PROPOFOL 10 MG/ML IV EMUL
INTRAVENOUS | Status: DC | PRN
  Administered 2012-06-17: 150 mg via INTRAVENOUS

## 2012-06-17 MED ORDER — MANNITOL 25 % IV SOLN
INTRAVENOUS | Status: DC | PRN
  Administered 2012-06-17: 12.5 g via INTRAVENOUS

## 2012-06-17 MED ORDER — INSULIN ASPART 100 UNIT/ML ~~LOC~~ SOLN: 0.0000 [IU] | SUBCUTANEOUS | Status: DC

## 2012-06-17 MED ORDER — HEPARIN SODIUM (PORCINE) 1000 UNIT/ML IJ SOLN
INTRAMUSCULAR | Status: DC | PRN
  Administered 2012-06-17: 5000 [IU] via INTRAVENOUS
  Administered 2012-06-17: 3000 [IU] via INTRAVENOUS
  Administered 2012-06-17: 7000 [IU] via INTRAVENOUS

## 2012-06-17 MED ORDER — PROTAMINE SULFATE 10 MG/ML IV SOLN
INTRAVENOUS | Status: DC | PRN
  Administered 2012-06-17: 80 mg via INTRAVENOUS

## 2012-06-17 MED ORDER — NEOSTIGMINE METHYLSULFATE 1 MG/ML IJ SOLN
INTRAMUSCULAR | Status: DC | PRN
  Administered 2012-06-17: 4 mg via INTRAVENOUS

## 2012-06-17 MED ORDER — DEXMEDETOMIDINE HCL IN NACL 200 MCG/50ML IV SOLN
0.2000 ug/kg/h | INTRAVENOUS | Status: DC
  Administered 2012-06-17: 0.2 ug/kg/h via INTRAVENOUS
  Filled 2012-06-17: qty 50

## 2012-06-17 MED ORDER — SODIUM CHLORIDE 0.9 % IV SOLN
Freq: Once | INTRAVENOUS | Status: AC
  Administered 2012-06-17: 17:00:00 via INTRAVENOUS

## 2012-06-17 MED ORDER — SODIUM BICARBONATE 8.4 % IV SOLN
INTRAVENOUS | Status: DC | PRN
  Administered 2012-06-17: 50 meq via INTRAVENOUS

## 2012-06-17 MED ORDER — SODIUM CHLORIDE 0.9 % IV SOLN: 500.0000 mL | Freq: Once | INTRAVENOUS | Status: AC | PRN

## 2012-06-17 MED ORDER — ACETAMINOPHEN 325 MG PO TABS
325.0000 mg | ORAL_TABLET | ORAL | Status: DC | PRN
  Administered 2012-06-29: 650 mg via ORAL
  Filled 2012-06-17: qty 2

## 2012-06-17 MED ORDER — PHENYLEPHRINE HCL 10 MG/ML IJ SOLN
10.0000 mg | INTRAVENOUS | Status: DC | PRN
  Administered 2012-06-17: 10 ug/min via INTRAVENOUS

## 2012-06-17 MED ORDER — THROMBIN 20000 UNITS EX SOLR
CUTANEOUS | Status: AC
  Filled 2012-06-17: qty 20000

## 2012-06-17 MED ORDER — ONDANSETRON HCL 4 MG/2ML IJ SOLN
4.0000 mg | Freq: Four times a day (QID) | INTRAMUSCULAR | Status: DC | PRN
  Administered 2012-06-18 – 2012-06-30 (×10): 4 mg via INTRAVENOUS
  Filled 2012-06-17 (×10): qty 2

## 2012-06-17 MED ORDER — DEXTROSE-NACL 5-0.9 % IV SOLN
INTRAVENOUS | Status: DC
  Administered 2012-06-17: 17:00:00 via INTRAVENOUS

## 2012-06-17 MED ORDER — METOPROLOL TARTRATE 1 MG/ML IV SOLN
5.0000 mg | Freq: Four times a day (QID) | INTRAVENOUS | Status: DC
  Administered 2012-06-20 – 2012-06-29 (×8): 5 mg via INTRAVENOUS
  Filled 2012-06-17 (×48): qty 5

## 2012-06-17 MED ORDER — PHENOL 1.4 % MT LIQD
1.0000 | OROMUCOSAL | Status: DC | PRN
  Filled 2012-06-17: qty 177

## 2012-06-17 MED ORDER — SODIUM CHLORIDE 0.9 % IV SOLN: INTRAVENOUS | Status: DC

## 2012-06-17 SURGICAL SUPPLY — 70 items
ATTRACTOMAT 16X20 MAGNETIC DRP (DRAPES) IMPLANT
BLADE SURG 11 STRL SS (BLADE) ×3 IMPLANT
CANISTER SUCTION 2500CC (MISCELLANEOUS) ×3 IMPLANT
CANNULA VESSEL W/WING WO/VALVE (CANNULA) ×3 IMPLANT
CLIP TI MEDIUM 24 (CLIP) ×3 IMPLANT
CLIP TI WIDE RED SMALL 24 (CLIP) ×3 IMPLANT
CLOTH BEACON ORANGE TIMEOUT ST (SAFETY) ×3 IMPLANT
CONT SPEC 4OZ CLIKSEAL STRL BL (MISCELLANEOUS) ×3 IMPLANT
COVER SURGICAL LIGHT HANDLE (MISCELLANEOUS) ×3 IMPLANT
DERMABOND ADVANCED (GAUZE/BANDAGES/DRESSINGS) ×2
DERMABOND ADVANCED .7 DNX12 (GAUZE/BANDAGES/DRESSINGS) ×4 IMPLANT
DRAPE WARM FLUID 44X44 (DRAPE) ×3 IMPLANT
DRSG COVADERM 4X14 (GAUZE/BANDAGES/DRESSINGS) ×3 IMPLANT
DRSG COVADERM 4X6 (GAUZE/BANDAGES/DRESSINGS) ×3 IMPLANT
ELECT BLADE 6.5 EXT (BLADE) IMPLANT
ELECT REM PT RETURN 9FT ADLT (ELECTROSURGICAL) ×6
ELECTRODE REM PT RTRN 9FT ADLT (ELECTROSURGICAL) ×4 IMPLANT
GEL ULTRASOUND 20GR AQUASONIC (MISCELLANEOUS) ×6 IMPLANT
GLOVE BIO SURGEON STRL SZ 6.5 (GLOVE) ×3 IMPLANT
GLOVE BIO SURGEON STRL SZ7 (GLOVE) ×3 IMPLANT
GLOVE BIO SURGEON STRL SZ7.5 (GLOVE) ×6 IMPLANT
GLOVE BIOGEL PI IND STRL 6.5 (GLOVE) ×4 IMPLANT
GLOVE BIOGEL PI IND STRL 7.0 (GLOVE) ×2 IMPLANT
GLOVE BIOGEL PI IND STRL 7.5 (GLOVE) ×6 IMPLANT
GLOVE BIOGEL PI INDICATOR 6.5 (GLOVE) ×2
GLOVE BIOGEL PI INDICATOR 7.0 (GLOVE) ×1
GLOVE BIOGEL PI INDICATOR 7.5 (GLOVE) ×3
GLOVE ECLIPSE 7.5 STRL STRAW (GLOVE) ×3 IMPLANT
GLOVE SURG SS PI 6.5 STRL IVOR (GLOVE) ×3 IMPLANT
GLOVE SURG SS PI 7.5 STRL IVOR (GLOVE) ×3 IMPLANT
GOWN PREVENTION PLUS XLARGE (GOWN DISPOSABLE) ×15 IMPLANT
GOWN STRL NON-REIN LRG LVL3 (GOWN DISPOSABLE) ×6 IMPLANT
GOWN STRL REIN XL XLG (GOWN DISPOSABLE) ×3 IMPLANT
GRAFT HEMASHIELD 12X7MM (Vascular Products) ×3 IMPLANT
INSERT FOGARTY 61MM (MISCELLANEOUS) ×3 IMPLANT
INSERT FOGARTY SM (MISCELLANEOUS) ×6 IMPLANT
KIT BASIN OR (CUSTOM PROCEDURE TRAY) ×3 IMPLANT
KIT ROOM TURNOVER OR (KITS) ×3 IMPLANT
LOOP VESSEL MINI RED (MISCELLANEOUS) ×9 IMPLANT
NEEDLE 18GX1X1/2 (RX/OR ONLY) (NEEDLE) ×3 IMPLANT
NS IRRIG 1000ML POUR BTL (IV SOLUTION) ×9 IMPLANT
PACK AORTA (CUSTOM PROCEDURE TRAY) ×3 IMPLANT
PAD ARMBOARD 7.5X6 YLW CONV (MISCELLANEOUS) ×6 IMPLANT
RETAINER VISCERA MED (MISCELLANEOUS) ×3 IMPLANT
SPECIMEN JAR MEDIUM (MISCELLANEOUS) ×3 IMPLANT
SPONGE LAP 18X18 X RAY DECT (DISPOSABLE) ×3 IMPLANT
SPONGE SURGIFOAM ABS GEL 100 (HEMOSTASIS) IMPLANT
STAPLER VISISTAT 35W (STAPLE) ×6 IMPLANT
STRIP PERIGUARD 6X8 (Vascular Products) ×3 IMPLANT
SUT PDS AB 1 TP1 54 (SUTURE) ×6 IMPLANT
SUT PROLENE 3 0 SH DA (SUTURE) IMPLANT
SUT PROLENE 3 0 SH1 36 (SUTURE) ×9 IMPLANT
SUT PROLENE 5 0 C 1 36 (SUTURE) ×6 IMPLANT
SUT PROLENE 5 0 CC 1 (SUTURE) ×6 IMPLANT
SUT PROLENE 6 0 C 1 30 (SUTURE) ×3 IMPLANT
SUT PROLENE 6 0 CC (SUTURE) ×12 IMPLANT
SUT SILK 2 0 (SUTURE) ×1
SUT SILK 2 0SH CR/8 30 (SUTURE) ×6 IMPLANT
SUT SILK 2-0 18XBRD TIE 12 (SUTURE) ×2 IMPLANT
SUT SILK 3 0 (SUTURE) ×1
SUT SILK 3-0 18XBRD TIE 12 (SUTURE) ×2 IMPLANT
SUT VIC AB 2-0 CT1 36 (SUTURE) ×6 IMPLANT
SUT VIC AB 3-0 SH 27 (SUTURE) ×5
SUT VIC AB 3-0 SH 27X BRD (SUTURE) ×10 IMPLANT
SUT VICRYL 4-0 PS2 18IN ABS (SUTURE) ×6 IMPLANT
SYRINGE 10CC LL (SYRINGE) ×3 IMPLANT
TOWEL OR 17X24 6PK STRL BLUE (TOWEL DISPOSABLE) ×6 IMPLANT
TOWEL OR 17X26 10 PK STRL BLUE (TOWEL DISPOSABLE) ×6 IMPLANT
TRAY FOLEY CATH 14FRSI W/METER (CATHETERS) ×3 IMPLANT
WATER STERILE IRR 1000ML POUR (IV SOLUTION) ×6 IMPLANT

## 2012-06-17 NOTE — Progress Notes (Signed)
eLink Physician-Brief Progress Note Patient Name: Lindsay [name] DOB: [birth year] MRN: [id number]  Date of Service  06/17/2012   HPI/Events of Note   RN called for consult  eICU Interventions  Vent orders given - increase RR to 20  Rpt ABG in 4h - metab acidosis Replete Mg, Ca given Dc swan -appears to be coiled in RV- RV tracing on monitor.      ALVA,RAKESH V. 06/17/2012, 9:31 PM

## 2012-06-17 NOTE — Preoperative (Signed)
Beta Blockers   Reason not to administer Beta Blockers:Not Applicable 

## 2012-06-17 NOTE — Anesthesia Procedure Notes (Signed)
Procedures Procedures: Right IJ Theone Murdoch Catheter Insertion: [id number]: The patient was identified and consent obtained.  TO was performed, and full barrier precautions were used.  The skin was anesthetized with lidocaine-4cc plain with 25g needle.  Once the vein was located with the 22 ga. needle using ultrasound guidance , the wire was inserted into the vein.  The wire location was confirmed with ultrasound.  The tissue was dilated and the 8.5 [place] cordis catheter was carefully inserted. Afterwards Theone Murdoch catheter was inserted. PA catheter at 45cm.  The patient tolerated the procedure well.   CE

## 2012-06-17 NOTE — OR Nursing (Signed)
Critical value Ca 6.3 reported to regina PA/ vasc --> no orders

## 2012-06-17 NOTE — Anesthesia Postprocedure Evaluation (Signed)
  Anesthesia Post-op Note  Patient: Lindsay [name]  Procedure(s) Performed: Procedure(s):  LEFT OOPHORECTOMY (Left) AORTOILIAC BYPASS (N/A) AORTIC ENDARTERECETOMY (N/A)  Patient Location: PACU  Anesthesia Type:General  Level of Consciousness: sedated  Airway and Oxygen Therapy: Patient remains intubated per anesthesia plan  Post-op Pain: none  Post-op Assessment: Post-op Vital signs reviewed, Patient's Cardiovascular Status Stable and RESPIRATORY FUNCTION UNSTABLE  Post-op Vital Signs: Reviewed and stable  Complications: No apparent anesthesia complications

## 2012-06-17 NOTE — OR Nursing (Signed)
Dr fields by, updated and is ok

## 2012-06-17 NOTE — Anesthesia Postprocedure Evaluation (Signed)
  Anesthesia Post-op Note  Patient: Lindsay [name]  Procedure(s) Performed: Procedure(s):  LEFT OOPHORECTOMY (Left) AORTOILIAC BYPASS (N/A) AORTIC ENDARTERECETOMY (N/A)  Patient Location: PACU  Anesthesia Type:General  Level of Consciousness: Patient remains intubated per anesthesia plan  Airway and Oxygen Therapy: Patient remains intubated per anesthesia plan  Post-op Pain: mild  Post-op Assessment: Post-op Vital signs reviewed  Post-op Vital Signs: Reviewed  Complications: No apparent anesthesia complications

## 2012-06-17 NOTE — Op Note (Addendum)
Procedure: Aortobifemoral bypass  Preoperative diagnosis: Claudication bilateral lower extremities  Postoperative diagnosis: Same  Anesthesia: Gen.  Assistant: Della Goo PA-C  Operative findings: #1 proximal anastomosis end to end with 12x 7 mm dacron graft end to end #2 bovine pericardium to cover retroperitoneum  Specimens: 1. Ovarian cyst 2. Pelvic washings 3. Aortic plaque  Operative details: After obtaining informed consent, the patient was taken to the operating room. The patient was placed in supine position on the operating table. After induction of general anesthesia and endotracheal ablation the patient was prepped and draped in usual sterile fashion from the nipples to the toes.   Next a midline laparotomy incision was made extending from the xiphoid to midway in the umbilicus and pubis. The incision was carried down to the level of the fascia the fascia was incised for the full length of the incision. Next the omentum was reflected superiorly as well as a transverse colon. The small bowel is reflected to the right. There was an 8 cm ovarian cyst in the left pelvis which was slightly adherent to the sigmoid colon.  This was removed by Dr Chiquita Loth and is dictated as a separate op note.  The ovary and pelvic washings were sent to pathology.  Next, the Omni-retractor was brought on to the operative field for assistance in retraction. The retroperitoneum was entered. The inferior mesenteric artery was identified and a vessel loop placed around this. Dissection was carried up on the anterior wall of the aorta up to the level of the left renal vein. Aorta was dissected free on its anterior two thirds surface from this level all the way down to the aortic bifurcation. Both common iliac arteries were dissected free circumferentially.  Several small lumbar arteries were controlled with clips.    The patient was given 8000 units of intravenous heparin. He was given an additional 5000 and  3000 unit boluses during the case.  After appropriate circulation time, the common iliacs were clamped. A vessel loop was used to control the inferior mesenteric artery. The renal arteries were dissected free circumferentially and vessel loops placed around these. Next the common iliacs were controlled with coarct clamps.   An aortic clamp was placed just above the renal arteries.  The left and right renal arteries were controlled with vessel loops to prevent embolization of the atherosclerotic debris in the aorta at the level of the renal vein. A longitudinal opening was made in the aorta just below the renal arteries. The was a large amount of granular thrombus and calcified plaque.  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  There was vigorous lumbar artery bleeding which was controlled with 2 0 silk sutures.  After the aorta was clean the clamp was moved below the renals and flow restored to the kidneys. The iliacs were severely diseased and small I did not believe an iliac or distal aortic anastomosis was possible.  A 12 x 7 mm dacron graft was brought onto the operative field. This was then sewn end of graft to end of artery using a running 3-0 Prolene suture. This was done end to end fashion. Just prior to completion of the anastomosis this was thoroughly flushed. Anastomosis was secured, clamps released, and there was good pulsatile flow the proximal graft. There was good hemostasis after 2 repair sutures. The common iliac arteries were doubly ligated with a 0 silk tie.  Next a longitudinal incision was made in the right groin carried into the subcutaneous tissues down  to level the right common femoral artery. The artery was dissected free circumferentially. There were some adhesions to the anterior wall the artery but overall it was soft but thick. The profunda femoris and superficial femoral arteries were dissected free circumferentially. These were fairly small approximately 3 mm in diameter.  The common femoral artery was approximately 4-5 mm in diameter. Several small side branches in the circumflex iliac branches were also dissected free circumferentially and vessel loops placed around these.  There was some posterior plaque.  A similar incision was made in the left groin. Incision was carried down into the subcutaneous tissues to level left common femoral artery. This was dissected free circumferentially. Several large side branches were dissected free circumferentially controlled with vessel loops. Superficial femoral and profunda femoris arteries were also dissected free circumferentially and vessel loops placed around these. A vessel loop was also placed around the distal external iliac artery. There was some posterior plaque on this side as well. Retroperitoneal tunnels were started in the groin on both sides with blunt dissection on the dorsal aspect of the artery.Retroperitoneal tunnels were created down the left and right iliac arteries to the groin. This was done a retroureteral retrocolic fashion. An umbilical table placed in the retroperitoneal tunnels.  The limbs of the graft were then brought out through the retro-peroneal tunnels to each groin. Attention was then turned to the right groin. The distal external iliac artery was controlled with a vessel loop. The common femoral artery profunda femoris and superficial femoral arteries were controlled with vessel loops. A longitudinal opening was made in the common femoral artery the graft was cut to length and beveled and sewn end graft to side of artery using a running 6-0 Prolene suture. Just prior to completion of the anastomosis it was forebled backbled and thoroughly flushed.   The anastomosis was secured, clamps released, and there was good flow into the distal limb immediately. There was a slight pressure drop which quickly recovered.  Patient had posterior tibial and dorsalis pedis doppler signal immediately. Hemostasis was obtained  in the right groin. Attention was then turned to the left groin. In similar fashion the graft was pulled down and cut to length. A longitudinal opening was made in the left common femoral artery and the graft beveled and sewn end of graft to side of artery using a running 6-0 Prolene suture. Just prior to completion of the anastomosis, it was forebled backbled and thoroughly flushed.   The anastomosis was secured, vessel loops released and there was good pulsatile flow in the left common femoral artery immediately. There was also a brief pressure drop which quickly recovered.  Patient also had dorsalis pedis and posterior tibial doppler at this point. Hemostasis was obtained the left groin.  Next attention was turned to the inferior mesenteric artery.  The vessel was quite small approximately 1 mm and the colon was pink and well perfused so this was ligated with a 2 0 silk tie.  The patient's heparin was partially reversed with 80 mg of protamine. Hemostasis had been obtained in all incisions at this point. The retroperitoneum was closed with a running 2-0 Vicryl suture also using a piece of bovine pericardium for additional retroperitoneal coverage of the graft as the pt had only minimal retroperitoneal tissue. The viscera were returned to their normal location and the Omni retractor was removed. The sigmoid colon was inspected and found to be pink with no evidence of ischemia. The fascia was then reapproximated using a  running #1 PDS suture. The skin was closed with staples. Each groin was then again inspected and thoroughly irrigated. Both were found to be hemostatic. The deep layers the groins were closed in multiple layers or running 2 0 and 3 0 Vicryl suture and a 4 0 Vicryl subcuticular stitch in the skin.  The patient tolerated procedure well and there were no complications. Sponge and needle counts were correct at the end of the case. The patient was taken to the recovery room after extubation in stable  condition.   Fabienne Bruns, MD

## 2012-06-17 NOTE — Transfer of Care (Signed)
Immediate Anesthesia Transfer of Care Note  Patient: Lindsay [name]  Procedure(s) Performed: Procedure(s):  LEFT OOPHORECTOMY (Left) AORTOILIAC BYPASS (N/A) AORTIC ENDARTERECETOMY (N/A)  Patient Location: PACU  Anesthesia Type:General  Level of Consciousness: sedated and Patient remains intubated per anesthesia plan  Airway & Oxygen Therapy: Patient remains intubated per anesthesia plan and Patient placed on Ventilator (see vital sign flow sheet for setting)  Post-op Assessment: Report given to PACU RN  Post vital signs: Reviewed and stable  Complications: No apparent anesthesia complications

## 2012-06-17 NOTE — Interval H&P Note (Signed)
History and Physical Interval Note:  06/17/2012 8:24 AM  Lindsay [name]  has presented today for surgery, with the diagnosis of AORTA ILIAC DISEASE OVARIAN CYST   The various methods of treatment have been discussed with the patient and family. After consideration of risks, benefits and other options for treatment, the patient has consented to  Procedure(s) with comments: AORTIC ENDARTERECETOMY POSSIBLE AORTOILIAC BYPASS (N/A) - POSSIBLE AORTOILIAC BYPASS  LEFT OOPHORECTOMY (Left) as a surgical intervention .  The patient's history has been reviewed, patient examined, no change in status, stable for surgery.  I have reviewed the patient's chart and labs.  Questions were answered to the patient's satisfaction.    Dr Audie Box to take out left ovary.   FIELDS,CHARLES E

## 2012-06-17 NOTE — Op Note (Signed)
Lindsay [name] 1945/05/04 [id number]   Post Operative Gynecology Note   Date of surgery:  06/17/2012  Pre Op Dx:  Left adnexal cystic mass  Post Op Dx:  Left adnexal cystic mass  Procedure:  Excision left adnexal cystic mass  Surgeon:  Dara Lords  Assistant:  Fabienne Bruns  Anesthesia:  General  EBL:  Minimal  Complications:  None  Specimen:  #1 opening cell washings #2 intact left cystic adnexal mass  to pathology  Findings:  7 cm cystic mass, left adnexa removed intact. No gross evidence of remaining ovary or fallopian tube. Pelvis otherwise normal to inspection and palpation.  Procedure:  Patient was taken to the operating room, underwent general anesthesia, received an abdominal preparation and draping per cardiovascular service. A timeout was performed and Dr. Darrick Penna opened the abdomen from sternum to pubis with a midline incision. The pelvis was exposed and the cystic mass was palpated in the left pelvic region at the pelvic brim with overlying sigmoid colon.  Inspection of the remainder of the pelvis was normal without visual or palpable abnormalities. The peritoneal surface overlying the cyst was incised and through sharp and blunt dissection the cystic mass was freed from its attachments and overlying sigmoid colon. With progressive freeing up of the mass it was apparent that there was no substantial vascular pedicle and the mass was excised intact and sent to pathology. An opening cell washing was also taken and also sent to pathology with heparinization. There was no clear ovarian tissue or fallopian tube within the removed cystic mass which appeared on inspection and palpation to be cystic without any solid components. Reinspection of the pelvis showed no remaining obvious ovarian tissue or fallopian tube segment and the dissection field was above the iliac vessels and course of the ureter.  Adequate hemostasis was visualized at this point and Dr. Darrick Penna progressed with  his vascular surgery and I scrubbed out of the case.   Dara Lords MD, 10:25 AM 06/17/2012

## 2012-06-17 NOTE — Addendum Note (Signed)
Addendum created 06/17/12 1907 by Judie Petit, MD   Modules edited: Anesthesia Blocks and Procedures, Anesthesia LDA

## 2012-06-17 NOTE — Consult Note (Addendum)
PULMONARY  / CRITICAL CARE MEDICINE  Name: Lindsay [name] MRN: [id number] DOB: 10/31/1945    ADMISSION DATE:  06/17/2012 CONSULTATION DATE:  06/17/2012  REFERRING MD :  [facility]  CHIEF COMPLAINT:  S/p Aortobifem  BRIEF PATIENT DESCRIPTION: 67 y/o female with abdominal aortic stenosis, COPD who underwent an aorto-bifem and removal of a L adnexal mass on 06/17/2012.  PCCM consulted for post op vent management.  SIGNIFICANT EVENTS / STUDIES:  3/5 aorto-bifem bypass 3/5 removal of L adnexal mass  LINES / TUBES: 3/5 R IJ introducer >> 3/5 ETT >> 3/5 L radial a-line >>  CULTURES:   ANTIBIOTICS: Cefuroxime periop >   HISTORY OF PRESENT ILLNESS:   67 y/o female with abdominal aortic stenosis, COPD who underwent an aorto-bifem and removal of a L adnexal mass on 06/17/2012.  PCCM consulted for post op vent management.  She was intubated in the PACU due to a respiratory acidosis.  Per chart review there were no post operative complications.  Her blood pressure was slightly low when returning to 2300.  No further history could be obtained due to intubation.  In the OR she received 2 U PRBC, 5L LR, 500cc NS, and 750 mL albumin.  Her EBL was 1.7L and urine output during the case was adequate.  PAST MEDICAL HISTORY :  Past Medical History  Diagnosis Date  . Abdominal aortic stenosis   . 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 H/O hiatal hernia   . Shortness of breath     pt states food gets stuck occasionally and she can't breath so has inhalerlSOB with exertion  . History of bronchitis     early 2014  . Pneumonia     hx of;last time in early 2013  . Headache   . Vertigo   . Hand numbness     left hand  . Chronic back pain     low back  . Dry skin   . Esophageal reflux     takes Nexium daily  . Constipation   . Urinary incontinence   . Cataracts, bilateral     immature  . Depression     doesn't take any medication  . Anxiety     doesn't  take any medications  . Insomnia    Past Surgical History  Procedure Laterality Date  . Abdominal hysterectomy      TAH RSO  . Hernia repair Left   . Eye surgery Left     x 7  . Carpal tunnel release Right   . Appendectomy    . Colonoscopy    . Esophagogastroduodenoscopy     Prior to Admission medications   Medication Sig Start Date End Date Taking? Authorizing Provider  albuterol (PROVENTIL HFA;VENTOLIN HFA) 108 (90 BASE) MCG/ACT inhaler Inhale 2 puffs into the lungs every 6 (six) hours as needed for wheezing. For wheezing   Yes Historical Provider, MD  aspirin EC 81 MG tablet Take 81 mg by mouth daily.   Yes Historical Provider, MD  esomeprazole (NEXIUM) 40 MG capsule Take 40 mg by mouth daily before breakfast.   Yes Historical Provider, MD  HYDROcodone-acetaminophen (NORCO/VICODIN) 5-325 MG per tablet Take 1 tablet by mouth every 6 (six) hours as needed for pain. For pain   Yes Historical Provider, MD   Allergies  Allergen Reactions  . Sulfa Antibiotics Anaphylaxis    Can not take ANYTHING with SULFA.  Maude Leriche Wash &Cleanser Anaphylaxis  FAMILY HISTORY:  Family History  Problem Relation Age of Onset  . Heart attack Mother   . Diabetes Mother   . Heart disease Mother   . Hyperlipidemia Mother   . Hypertension Mother   . Lung disease Father     Black Lung  . Heart disease Father     before age 37  . Hyperlipidemia Father   . Hypertension Father   . Heart attack Father   . Diabetes Sister   . Heart disease Sister     before age 40  . Hyperlipidemia Sister   . Hypertension Sister   . Heart attack Sister   . Diabetes Brother   . Heart disease Brother     before age 75  . Hypertension Brother   . Heart attack Brother    SOCIAL HISTORY:  reports that she has been smoking Cigarettes.  She has a 30 pack-year smoking history. She has never used smokeless tobacco. She reports that she does not drink alcohol or use illicit drugs.  REVIEW OF SYSTEMS:   Cannot obtain due to intubation  SUBJECTIVE:   VITAL SIGNS: Temp:  [96 F (35.6 C)-98.2 F (36.8 C)] 98.2 F (36.8 C) (03/05 2100) Pulse Rate:  [56-81] 79 (03/05 2200) Resp:  [5-20] 20 (03/05 2200) BP: (83-151)/(49-76) 91/52 mmHg (03/05 2200) SpO2:  [97 %-100 %] 98 % (03/05 2200) Arterial Line BP: (73-132)/(25-58) 113/54 mmHg (03/05 2200) FiO2 (%):  [50 %-100 %] 50 % (03/05 2013) Weight:  [58.8 kg (129 lb 10.1 oz)] 58.8 kg (129 lb 10.1 oz) (03/05 1635) HEMODYNAMICS: PAP: (35-51)/(8-15) 35/8 mmHg CO:  [2.8 L/min-3.5 L/min] 3.5 L/min CI:  [1.7 L/min/m2-2.1 L/min/m2] 2.1 L/min/m2 VENTILATOR SETTINGS: Vent Mode:  [-] PRVC FiO2 (%):  [50 %-100 %] 50 % Set Rate:  [14 bmp-15 bmp] 15 bmp Vt Set:  [500 mL] 500 mL PEEP:  [5 cmH20] 5 cmH20 Plateau Pressure:  [18 cmH20-22 cmH20] 18 cmH20 INTAKE / OUTPUT: Intake/Output     03/05 0701 - 03/06 0700   I.V. (mL/kg) 6423.8 (109.2)   Blood 1300   NG/GT 60   IV Piggyback 960   Total Intake(mL/kg) 8743.8 (148.7)   Urine (mL/kg/hr) 735 (0.8)   Blood 1700 (1.9)   Total Output 2435   Net +6308.8         PHYSICAL EXAMINATION: Gen: sedated on vent HEENT: NCAT, PERRL, EOMi, ETT in place PULM: CTA B CV: RRR, no mgr, no JVD AB: BS infrequent, tender near incision site Ext: warm, R DP palpable, L DP noted via doppler Derm: no rash or skin breakdown Neuro: sedated but arouses easily and follows commands   LABS:  Recent Labs Lab 06/16/12 1421  06/17/12 1221  06/17/12 1400 06/17/12 1444 06/17/12 1535 06/17/12 2041 06/17/12 2155  HGB 14.6  < > 7.1*  < > 10.5* 12.6  --   --  13.7  WBC 10.7*  --   --   --   --  20.7*  --   --  16.2*  PLT 208  --   --   --   --  113*  --   --  125*  NA 139  < > 141  < > 140 141  --   --  139  K 3.7  < > 4.2  < > 5.7* 5.0  --   --  4.2  CL 104  --   --   --   --  113*  --   --  113*  CO2 20  --   --   --   --  19  --   --  19  GLUCOSE 98  < > 166*  --   --  141*  --   --  229*  BUN 9  --   --   --    --  10  --   --  11  CREATININE 0.69  --   --   --   --  0.71  --   --  0.84  CALCIUM 9.6  --   --   --   --  6.3*  --   --  6.6*  MG  --   --   --   --   --  1.2*  --   --   --   AST 23  --   --   --   --   --   --   --  32  ALT 20  --   --   --   --   --   --   --  14  ALKPHOS 76  --   --   --   --   --   --   --  32*  BILITOT 0.2*  --   --   --   --   --   --   --  0.6  PROT 6.9  --   --   --   --   --   --   --  4.1*  ALBUMIN 3.5  --   --   --   --   --   --   --  2.4*  APTT 22*  --   --   --   --  38*  --   --   --   INR 0.85  --   --   --   --  1.49  --   --   --   PHART  --   < >  --   < > 7.378  --  7.242* 7.226*  --   PCO2ART  --   < >  --   < > 41.6  --  44.7 44.9  --   PO2ART  --   < >  --   < > 543.0*  --  309.0* 107.0*  --   < > = values in this interval not displayed. No results found for this basename: GLUCAP,  in the last 168 hours  3/5 CXR: emphysema, clear lungs, ETT in place 3/5 EKG > NSR, no ST wave changes  ASSESSMENT / PLAN:  PULMONARY A: Post op respiratory failure: presumably some resp acidosis (inappropriate compensation) due to sedation, splinting from pain and COPD; does not appear to be in COPD exacerbation P:   -full vent support -repeat ABG after RR increased -scheduled and prn albuterol -likely extubation 3/6  CARDIOVASCULAR A: Abdominal aortic stenosis now s/p aorto-bifem bypass 3/5 Tobacco abuse P:  -per vascular surgery -educate to quit smoking post op  RENAL A:  Post op hypocalcemia and hypomagnesemia Metabolic acidosis - non-gap most likely too much Cl intraoperatively P:   -replace electrolytes and repeat CMET with Mg 3/5 PM -repeat ABG 2300 3/5, check lactic acid if still met acidosis  GASTROINTESTINAL A:  No acute issues P:   -OG tube -pepcid bid for stress ulcer prophylaxis  HEMATOLOGIC A:  No acute issues; received 2 U PRBC intraoperatively P:  -  check post op cbc  INFECTIOUS A:  No acute issues P:   -monitor temp  curve  ENDOCRINE A:  Post op hyperglycemia P:   -CBG monitoring and SSI  NEUROLOGIC A:  Post op pain and sedation needs for vent synchrony P:   -use morphine for pain -precedex for sedation, titrate to RASS 0 to -1  GYNE: A: Adnexal mass removed by Gyn-onc in OR 3/5 P: -per gyne-onc  TODAY'S SUMMARY: 67 y/o female with COPD who is s/p aorto-bifem bypass on 3/5.  PCCM consulted for vent management.  I have personally obtained a history, examined the patient, evaluated laboratory and imaging results, formulated the assessment and plan and placed orders. CRITICAL CARE: The patient is critically ill with multiple organ systems failure and requires high complexity decision making for assessment and support, frequent evaluation and titration of therapies, application of advanced monitoring technologies and extensive interpretation of multiple databases. Critical Care Time devoted to patient care services described in this note is 45 minutes.   Fonnie Jarvis Pulmonary and Critical Care Medicine [facility] Pager: [phone]  06/17/2012, 10:27 PM

## 2012-06-17 NOTE — OR Nursing (Signed)
Dr fields aware of sbp and orders for 1l NS given / and done.

## 2012-06-17 NOTE — Anesthesia Preprocedure Evaluation (Addendum)
Anesthesia Evaluation  Patient identified by MRN, date of birth, ID band Patient awake    Reviewed: Allergy & Precautions, H&P , NPO status , Patient's Chart, lab work & pertinent test results  Airway Mallampati: II TM Distance: <3 FB Neck ROM: Limited  Mouth opening: Limited Mouth Opening  Dental  (+) Poor Dentition, Partial Upper and Loose   Pulmonary shortness of breath, pneumonia -, Current Smoker,    Pulmonary exam normal       Cardiovascular + Peripheral Vascular Disease     Neuro/Psych  Headaches, PSYCHIATRIC DISORDERS Anxiety Depression    GI/Hepatic Neg liver ROS, hiatal hernia, GERD-  Medicated,  Endo/Other  negative endocrine ROS  Renal/GU negative Renal ROS     Musculoskeletal   Abdominal Normal abdominal exam  (+)   Peds  Hematology   Anesthesia Other Findings   Reproductive/Obstetrics                         Anesthesia Physical Anesthesia Plan  ASA: IV  Anesthesia Plan: General   Post-op Pain Management:    Induction: Intravenous  Airway Management Planned: Oral ETT and Video Laryngoscope Planned  Additional Equipment: Arterial line, CVP, PA Cath and Ultrasound Guidance Line Placement  Intra-op Plan:   Post-operative Plan: Extubation in OR  Informed Consent: I have reviewed the patients History and Physical, chart, labs and discussed the procedure including the risks, benefits and alternatives for the proposed anesthesia with the patient or authorized representative who has indicated his/her understanding and acceptance.   Dental advisory given  Plan Discussed with: CRNA, Anesthesiologist and Surgeon  Anesthesia Plan Comments: (Normal EF.  )       Anesthesia Quick Evaluation

## 2012-06-17 NOTE — H&P (View-Only) (Signed)
[facility] [place] HISTORY AND PHYSICAL  [facility] Consultation  Referring physician: Joellyn Quails MD History of Present Illness:  Patient is a 67 y.o. year old female who presents for evaluation of lower extremity claudication. She was seen by Dr. Terrilee Croak recently for evaluation of leg weakness. She was found on MRI scan to have evidence of aortic stenosis in the abdominal portion. The patient states she has been having difficulty walking more than 50-100 feet for the last 6 months. She denies rest pain. She denies numbness or tingling in her feet. She has no history of hypertension elevated cholesterol or diabetes. She does smoke approximately 1/2 to one pack of cigarettes per day. Greater than 3 minutes they were spent regarding smoking cessation counseling. She is interested in quitting. She has had some dysphagia but states that she thinks this is related to her hiatal hernia and reflux. She denies food fear. She denies weight loss. She gets chest pain with exertion but again blames this on her reflux. She becomes short of breath climbing more than 2 flights of stairs. She states she did have a stress test in the past at [facility]. This is not available for review today. The MRI report is available the images are not available for viewing. She also complains of recent numbness and tingling in the digits of her left hand 34 and 5. This is intermittent in nature..  Other medical problems include reflux COPD and arthritis all of which are currently stable.  Past Medical History  Diagnosis Date  . Abdominal aortic stenosis   . Esophageal reflux   . COPD (chronic obstructive pulmonary disease)   . Arthritis 03/16/12    Minimal Degenerative Disc Disease  L3-4 and L 4-5    Past Surgical History  Procedure Date  . Abdominal hysterectomy   . Hernia repair   . Eye surgery      Social History History  Substance Use Topics  .  Smoking status: Current Every Day Smoker -- 0.5 packs/day    Types: Cigarettes  . Smokeless tobacco: Never Used     Comment: pt states she is using E-cigs   . Alcohol Use: No    Family History Family History  Problem Relation Age of Onset  . Heart attack Mother   . Diabetes Mother   . Heart disease Mother   . Hyperlipidemia Mother   . Hypertension Mother   . Lung disease Father     Black Lung  . Heart disease Father     before age 87  . Hyperlipidemia Father   . Hypertension Father   . Heart attack Father   . Diabetes Sister   . Heart disease Sister     before age 20  . Hyperlipidemia Sister   . Hypertension Sister   . Heart attack Sister   . Diabetes Brother   . Heart disease Brother     before age 13  . Hypertension Brother   . Heart attack Brother     Allergies  Allergies  Allergen Reactions  . Sulfa Antibiotics   . Sulfacet-Sulfur Wash &Cleanser   . Sulfacetamide Sodium-Sulfur   . Sulfacetamide-Prednisolone      Current Outpatient Prescriptions  Medication Sig Dispense Refill  . esomeprazole (NEXIUM) 40 MG capsule Take 40 mg by mouth daily before breakfast.      . HYDROcodone-acetaminophen (LORTAB 5) 5-500 MG per tablet Take 1 tablet by mouth every 6 (six) hours as needed.  ROS:   General:  No weight loss, Fever, chills  HEENT: No recent headaches, no nasal bleeding, no visual changes, no sore throat  Neurologic: No dizziness, blackouts, seizures. No recent symptoms of stroke or mini- stroke. No recent episodes of slurred speech, or temporary blindness.  Cardiac: + recent episodes of chest pain/pressure, no shortness of breath at rest.  + shortness of breath with exertion.  Denies history of atrial fibrillation or irregular heartbeat  Vascular: No history of rest pain in feet.  + history of claudication.  No history of non-healing ulcer, No history of DVT   Pulmonary: No home oxygen, no productive cough, no hemoptysis,  No asthma or  wheezing  Musculoskeletal:  [ ]  Arthritis, [ x] Low back pain,  [ ]  Joint pain  Hematologic:No history of hypercoagulable state.  No history of easy bleeding.  No history of anemia  Gastrointestinal: No hematochezia or melena,  + gastroesophageal reflux, + trouble swallowing  Urinary: [ ]  chronic Kidney disease, [ ]  on HD - [ ]  MWF or [ ]  TTHS, [ ]  Burning with urination, [ ]  Frequent urination, [ ]  Difficulty urinating;   Skin: No rashes  Psychological: No history of anxiety,  No history of depression   Physical Examination  Filed Vitals:   05/21/12 0958  BP: 121/68  Pulse: 79  Height: 5\' 5"  (1.651 m)  Weight: 129 lb 11.2 oz (58.832 kg)  SpO2: 100%    Body mass index is 21.58 kg/(m^2).  General:  Alert and oriented, no acute distress HEENT: Normal Neck: No bruit or JVD Pulmonary: Clear to auscultation bilaterally Cardiac: Regular Rate and Rhythm without murmur Abdomen: Soft, non-tender, non-distended, no mass, lower midline scar, mild right upper quadrant tenderness on palpation Skin: No rash Extremity Pulses:  2+ radial, brachial, absent femoral, dorsalis pedis, posterior tibial pulses bilaterally Musculoskeletal: No deformity or edema  Neurologic: Upper and lower extremity motor 5/5 and symmetric  DATA: The patient had bilateral ABIs today which are reviewed and interpreted. ABI on the right was 0.54 left was 0.522 pressure 50 on the left 70 on the right waveforms were monophasic from the femoral to dorsalis pedis   ASSESSMENT:  Aortoiliac occlusive disease. Disabling claudication.  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 Possible coronary artery disease.   PLAN:1.  The patient was advised to quit smoking. #2 we have scheduled the patient for cardiology evaluation at [facility] cardiology possible stress test to evaluate her chest pain as well as the possibility that she may require aortobifemoral bypass grafting #3 CT angiogram of the abdomen and pelvis with lower  extremity runoff to further define her mesenteric circulation her aortoiliac circulation in her lower extremity runoff #4 she will followup in 2 weeks to review the results of the above testing #5 she was placed on one aspirin daily today  Fabienne Bruns, MD [facility] [place] Office: [phone] Pager: [phone]

## 2012-06-18 ENCOUNTER — Encounter (HOSPITAL_COMMUNITY): Payer: Self-pay | Admitting: Vascular Surgery

## 2012-06-18 ENCOUNTER — Inpatient Hospital Stay (HOSPITAL_COMMUNITY): Payer: Medicare PPO

## 2012-06-18 DIAGNOSIS — J9600 Acute respiratory failure, unspecified whether with hypoxia or hypercapnia: Secondary | ICD-10-CM | POA: Diagnosis not present

## 2012-06-18 DIAGNOSIS — I959 Hypotension, unspecified: Secondary | ICD-10-CM

## 2012-06-18 DIAGNOSIS — J449 Chronic obstructive pulmonary disease, unspecified: Secondary | ICD-10-CM | POA: Diagnosis present

## 2012-06-18 LAB — COMPREHENSIVE METABOLIC PANEL
Alkaline Phosphatase: 32 U/L — ABNORMAL LOW (ref 39–117)
BUN: 13 mg/dL (ref 6–23)
CO2: 19 mEq/L (ref 19–32)
GFR calc Af Amer: 73 mL/min — ABNORMAL LOW (ref >90)
GFR calc non Af Amer: 63 mL/min — ABNORMAL LOW (ref >90)
Glucose, Bld: 228 mg/dL — ABNORMAL HIGH (ref 70–99)
Potassium: 3.9 mEq/L (ref 3.5–5.1)
Total Protein: 3.9 g/dL — ABNORMAL LOW (ref 6.0–8.3)

## 2012-06-18 LAB — MAGNESIUM: Magnesium: 1.9 mg/dL (ref 1.5–2.5)

## 2012-06-18 LAB — GLUCOSE, CAPILLARY
Glucose-Capillary: 203 mg/dL — ABNORMAL HIGH (ref 70–99)
Glucose-Capillary: 94 mg/dL (ref 70–99)
Glucose-Capillary: 96 mg/dL (ref 70–99)

## 2012-06-18 LAB — CBC
HCT: 36.5 % (ref 36.0–46.0)
Hemoglobin: 12.5 g/dL (ref 12.0–15.0)
MCH: 28.7 pg (ref 26.0–34.0)
MCHC: 34.2 g/dL (ref 30.0–36.0)
RBC: 4.35 MIL/uL (ref 3.87–5.11)

## 2012-06-18 LAB — HEMOGLOBIN A1C: Hgb A1c MFr Bld: 6.2 % — ABNORMAL HIGH (ref <5.7)

## 2012-06-18 MED ORDER — ALBUTEROL SULFATE (5 MG/ML) 0.5% IN NEBU: 2.5000 mg | INHALATION_SOLUTION | RESPIRATORY_TRACT | Status: DC | PRN

## 2012-06-18 MED ORDER — SODIUM CHLORIDE 0.9 % IV BOLUS (SEPSIS)
500.0000 mL | Freq: Once | INTRAVENOUS | Status: AC
  Administered 2012-06-18: 500 mL via INTRAVENOUS

## 2012-06-18 MED ORDER — ALBUTEROL SULFATE (5 MG/ML) 0.5% IN NEBU
INHALATION_SOLUTION | RESPIRATORY_TRACT | Status: AC
  Administered 2012-06-18: 2.5 mg
  Filled 2012-06-18: qty 0.5

## 2012-06-18 MED ORDER — NICOTINE 14 MG/24HR TD PT24
14.0000 mg | MEDICATED_PATCH | Freq: Every day | TRANSDERMAL | Status: DC
  Administered 2012-06-18 – 2012-07-01 (×14): 14 mg via TRANSDERMAL
  Filled 2012-06-18 (×14): qty 1

## 2012-06-18 MED ORDER — IPRATROPIUM BROMIDE 0.02 % IN SOLN
RESPIRATORY_TRACT | Status: AC
  Administered 2012-06-18: 0.5 mg
  Filled 2012-06-18: qty 2.5

## 2012-06-18 MED ORDER — ALBUTEROL SULFATE (5 MG/ML) 0.5% IN NEBU
2.5000 mg | INHALATION_SOLUTION | Freq: Four times a day (QID) | RESPIRATORY_TRACT | Status: DC
  Administered 2012-06-18 (×2): 2.5 mg via RESPIRATORY_TRACT
  Filled 2012-06-18 (×2): qty 0.5

## 2012-06-18 MED ORDER — FENTANYL CITRATE 0.05 MG/ML IJ SOLN
12.5000 ug | INTRAMUSCULAR | Status: DC | PRN
  Administered 2012-06-18 (×3): 25 ug via INTRAVENOUS
  Administered 2012-06-18: 6.25 ug via INTRAVENOUS
  Administered 2012-06-18: 12.5 ug via INTRAVENOUS
  Administered 2012-06-19: 25 ug via INTRAVENOUS
  Administered 2012-06-19: 6.25 ug via INTRAVENOUS
  Administered 2012-06-19: 12.5 ug via INTRAVENOUS
  Administered 2012-06-19: 25 ug via INTRAVENOUS
  Administered 2012-06-19: 6.25 ug via INTRAVENOUS
  Filled 2012-06-18 (×6): qty 2

## 2012-06-18 MED ORDER — CHLORHEXIDINE GLUCONATE 0.12 % MT SOLN
15.0000 mL | Freq: Two times a day (BID) | OROMUCOSAL | Status: DC
  Administered 2012-06-18 – 2012-07-01 (×24): 15 mL via OROMUCOSAL
  Filled 2012-06-18 (×26): qty 15

## 2012-06-18 MED ORDER — POTASSIUM CHLORIDE IN NACL 20-0.9 MEQ/L-% IV SOLN
INTRAVENOUS | Status: DC
  Administered 2012-06-18 – 2012-06-19 (×3): via INTRAVENOUS
  Filled 2012-06-18 (×7): qty 1000

## 2012-06-18 MED ORDER — SODIUM CHLORIDE 0.9 % IV SOLN
Freq: Once | INTRAVENOUS | Status: AC
  Administered 2012-06-18: 10:00:00 via INTRAVENOUS

## 2012-06-18 MED ORDER — FENTANYL CITRATE 0.05 MG/ML IJ SOLN: 25.0000 ug | INTRAMUSCULAR | Status: DC | PRN

## 2012-06-18 MED ORDER — CHLORHEXIDINE GLUCONATE 0.12 % MT SOLN
OROMUCOSAL | Status: AC
  Administered 2012-06-18: 15 mL
  Filled 2012-06-18: qty 15

## 2012-06-18 MED ORDER — IPRATROPIUM BROMIDE 0.02 % IN SOLN
0.5000 mg | Freq: Four times a day (QID) | RESPIRATORY_TRACT | Status: DC
  Administered 2012-06-18 (×2): 0.5 mg via RESPIRATORY_TRACT
  Filled 2012-06-18 (×2): qty 2.5

## 2012-06-18 MED FILL — Heparin Sodium (Porcine) Inj 1000 Unit/ML: INTRAMUSCULAR | Qty: 30 | Status: AC

## 2012-06-18 MED FILL — Sodium Chloride IV Soln 0.9%: INTRAVENOUS | Qty: 1000 | Status: AC

## 2012-06-18 MED FILL — Sodium Chloride Irrigation Soln 0.9%: Qty: 3000 | Status: AC

## 2012-06-18 NOTE — Evaluation (Signed)
Occupational Therapy Evaluation Patient Details Name: Lindsay [name] MRN: [id number] DOB: 09/30/1945 Today's Date: 06/18/2012 Time: 4132-4401 OT Time Calculation (min): 22 min  OT Assessment / Plan / Recommendation Clinical Impression  Pt s/p aortobifemoral BPG. Will benefit from continued OT services to address below problem list. Recommending CIR to further progress rehab in prep for return home.    OT Assessment  Patient needs continued OT Services    Follow Up Recommendations  CIR    Barriers to Discharge None    Equipment Recommendations  3 in 1 bedside comode    Recommendations for Other Services    Frequency  Min 2X/week    Precautions / Restrictions Precautions Precautions: Fall Restrictions Weight Bearing Restrictions: No   Pertinent Vitals/Pain See vitals    ADL  Grooming: Performed;Wash/dry face;Moderate assistance Where Assessed - Grooming: Supine, head of bed up Upper Body Bathing: Simulated;Maximal assistance Where Assessed - Upper Body Bathing: Supported sitting Lower Body Bathing: Simulated;Maximal assistance Where Assessed - Lower Body Bathing: Supported sitting Upper Body Dressing: Simulated;Maximal assistance Where Assessed - Upper Body Dressing: Supported sitting Lower Body Dressing: Performed;+1 Total assistance Where Assessed - Lower Body Dressing: Supported sitting Toilet Transfer: Simulated;+2 Total assistance Toilet Transfer: Patient Percentage: 50% Statistician Method: Surveyor, minerals: Other (comment) (bed to chair) Transfers/Ambulation Related to ADLs: +2 total assist (pt=50-60%) during SPT from bed to chair. ADL Comments: Pt limited by pain and nausea (not due for any meds at this time per RN).     OT Diagnosis: Generalized weakness;Acute pain  OT Problem List: Decreased strength;Decreased activity tolerance;Decreased knowledge of use of DME or AE;Decreased knowledge of precautions;Pain;Impaired balance (sitting  and/or standing) OT Treatment Interventions: Self-care/ADL training;Therapeutic exercise;DME and/or AE instruction;Therapeutic activities;Patient/family education;Balance training   OT Goals Acute Rehab OT Goals OT Goal Formulation: With patient Time For Goal Achievement: 07/02/12 Potential to Achieve Goals: Good ADL Goals Pt Will Perform Grooming: with supervision;Standing at sink (3 tasks) ADL Goal: Grooming - Progress: Goal set today Pt Will Transfer to Toilet: with supervision;Ambulation;with DME;Comfort height toilet;3-in-1 ADL Goal: Toilet Transfer - Progress: Goal set today Pt Will Perform Toileting - Clothing Manipulation: with supervision;Standing ADL Goal: Toileting - Clothing Manipulation - Progress: Goal set today Pt Will Perform Toileting - Hygiene: with supervision;Sit to stand from 3-in-1/toilet ADL Goal: Toileting - Hygiene - Progress: Goal set today Arm Goals Pt Will Perform AROM: Independently;Bilateral upper extremities;10 reps Arm Goal: AROM - Progress: Goal set today Miscellaneous OT Goals Miscellaneous OT Goal #1: Pt will perform bathing/dressing ADLs at supervision level with AE as needed. OT Goal: Miscellaneous Goal #1 - Progress: Goal set today Miscellaneous OT Goal #2: Pt will perform static sitting balance task EOB >10 min at supervision level as precursor for ADL retraining. OT Goal: Miscellaneous Goal #2 - Progress: Goal set today  Visit Information  Last OT Received On: 06/18/12 Assistance Needed: +2 PT/OT Co-Evaluation/Treatment: Yes    Subjective Data      Prior Functioning     Home Living Lives With: Spouse;Family Available Help at Discharge: Family;Available 24 hours/day Type of Home: Mobile home Home Access: Stairs to enter Entrance Stairs-Number of Steps: 2 Entrance Stairs-Rails: None Home Layout: One level Bathroom Shower/Tub: Health visitor: Standard Home Adaptive Equipment: Built-in shower seat Prior  Function Level of Independence: Independent Communication Communication: No difficulties Dominant Hand: Right         Vision/Perception     Cognition  Cognition Overall Cognitive Status: Appears within functional limits for  tasks assessed/performed Arousal/Alertness: Lethargic Orientation Level: Appears intact for tasks assessed Behavior During Session: Lethargic Cognition - Other Comments: Had a lot of pain medications.  Lethargic throughout    Extremity/Trunk Assessment Right Upper Extremity Assessment RUE ROM/Strength/Tone: Unable to fully assess;Due to pain Left Upper Extremity Assessment LUE ROM/Strength/Tone: Unable to fully assess;Due to pain Right Lower Extremity Assessment RLE ROM/Strength/Tone: Deficits;Due to pain RLE ROM/Strength/Tone Deficits: grossly 3/5 Left Lower Extremity Assessment LLE ROM/Strength/Tone: Deficits;Due to pain LLE ROM/Strength/Tone Deficits: grossly 3/5 Trunk Assessment Trunk Assessment: Normal     Mobility Bed Mobility Bed Mobility: Rolling Right;Right Sidelying to Sit;Sitting - Scoot to [place] of Bed Rolling Right: 1: +2 Total assist Rolling Right: Patient Percentage: 30% Right Sidelying to Sit: 1: +2 Total assist Right Sidelying to Sit: Patient Percentage: 30% Sitting - Scoot to Edge of Bed: 2: Max assist Details for Bed Mobility Assistance: Pt needed assist to move LES off bed as well as for elevation of trunk.  Pt needed assist using pad to scoot pt to EOB as she could not scoot out on her own.   Transfers Transfers: Sit to Stand;Stand to Sit Sit to Stand: 1: +2 Total assist;With upper extremity assist;From bed Sit to Stand: Patient Percentage: 50% Stand to Sit: 1: +2 Total assist;With upper extremity assist;With armrests;To chair/3-in-1 Stand to Sit: Patient Percentage: 50% Details for Transfer Assistance: Pt needed incr assist to intitiate sit to stand.  Once up, pt able to stand pivot around to recliner with assist and cues to  weight shift.  Pt with semi-upright posture secondary to pain.       Exercise     Balance Static Sitting Balance Static Sitting - Balance Support: Bilateral upper extremity supported;Feet supported Static Sitting - Level of Assistance: 3: Mod assist Static Sitting - Comment/# of Minutes: Needed mod A initially to sit EOB and progressed to needing min assist after sitting 2 minutes.  Sat a total of 4 minutes and then PT/OT assited pt to chair.   End of Session OT - End of Session Activity Tolerance: Patient limited by pain;Patient limited by fatigue (nausea) Patient left: in chair;with nursing in room Nurse Communication: Mobility status (nausea)  GO   06/18/2012 Cipriano Mile OTR/L Pager [phone] Office [phone]   Cipriano Mile 06/18/2012, 3:25 PM

## 2012-06-18 NOTE — Progress Notes (Addendum)
[facility] [place]  Post-op  Intra-abdominal Surgery note  Date of Surgery: 06/17/2012  Surgeon(s): Sherren Kerns, MD Dara Lords, MD  1 Day Post-Op Procedure(s):  LEFT OOPHORECTOMY AORTObifemoral BYPASS   History of Present Illness  Lindsay [name] is a 67 y.o. female who is  up s/p open aortic procedure. Pt is doing well. Awake on vent - following commands. BP still soft. On Dopamine.  complains of incisional pain but pt pressure drops with Morphine.  denies nausea/vomiting; denies diarrhea. Denies CP  IMAGING: Dg Chest 2 View  06/16/2012  *RADIOLOGY REPORT*  Clinical Data: Preoperative assessment for aortic endarterectomy, history smoking, bronchitis, pneumonia  CHEST - 2 VIEW  Comparison: 01/02/2011  Findings: Normal heart size, mediastinal contours, and pulmonary vascularity. Emphysematous changes with minimal atelectasis or scarring at bases. Questionable left nipple shadow versus nodular density or summation artifact at left base. Remaining lungs clear. No pleural effusion or pneumothorax. No acute osseous findings.  IMPRESSION: COPD changes with minimal atelectasis or scarring at bases. Question left nipple shadow versus pulmonary nodule; repeat PA chest radiograph with nipple markers recommended to exclude pulmonary nodule.   Original Report Authenticated By: Ulyses Southward, M.D.    Dg Chest Portable 1 View  06/17/2012  *RADIOLOGY REPORT*  Clinical Data: Evaluate endotracheal tube and Swan-Ganz catheter placement  PORTABLE CHEST - 1 VIEW  Comparison: Chest x-ray of 06/16/2012  Findings: The tip of the endotracheal tube is approximately 4.2 cm above the carina.  The Swan-Ganz catheter coils within the right atrium with the tip extending toward the right ventricle.  No pneumothorax is seen.  The lungs appear relatively well aerated. Heart size is stable.  IMPRESSION:  1.  Tip of endotracheal tube 4.2 cm above the carina. 2.  Swan-Ganz catheter coils in the  right atrium with the tip extending toward the right ventricle.   Original Report Authenticated By: Dwyane Dee, M.D.     Significant Diagnostic Studies: CBC Lab Results  Component Value Date   WBC 17.2* 06/18/2012   HGB 12.5 06/18/2012   HCT 36.5 06/18/2012   MCV 83.9 06/18/2012   PLT 126* 06/18/2012    BMET    Component Value Date/Time   NA 140 06/18/2012 0405   K 3.9 06/18/2012 0405   CL 115* 06/18/2012 0405   CO2 19 06/18/2012 0405   GLUCOSE 228* 06/18/2012 0405   BUN 13 06/18/2012 0405   CREATININE 0.91 06/18/2012 0405   CALCIUM 6.8* 06/18/2012 0405   GFRNONAA 63* 06/18/2012 0405   GFRAA 73* 06/18/2012 0405    COAG Lab Results  Component Value Date   INR 1.49 06/17/2012   INR 0.85 06/16/2012   No results found for this basename: PTT    I/O last 3 completed shifts: In: 10125.5 [I.V.:7595.5; Blood:1300; NG/GT:60; IV Piggyback:1170] Out: 2760 [Urine:1060; Blood:1700]    Physical Examination BP Readings from Last 3 Encounters:  06/18/12 99/42  06/18/12 99/42  06/18/12 99/42   Temp Readings from Last 3 Encounters:  06/18/12 98.6 F (37 C) Oral  06/18/12 98.6 F (37 C) Oral  06/18/12 98.6 F (37 C) Oral   SpO2 Readings from Last 3 Encounters:  06/18/12 97%  06/18/12 97%  06/18/12 97%   Pulse Readings from Last 3 Encounters:  06/18/12 78  06/18/12 78  06/18/12 78    General: A&O x 3, WDWN female in NAD Pulmonary: normal non-labored breathing  Cardiac: Heart rate : regular ,  Abdomen:abdomen soft Abdominal wound:clean, dry, intact  Neurologic: A&O X 3; Appropriate Affect ;  SENSATION: normal;  MOTOR FUNCTION:  moving all extremities equally.  Speech is fluent/normal  Vascular Exam:BLE warm and well perfused Extremities without ischemic changes, no Gangrene, no cellulitis; no open wounds;   LOWER EXTREMITY PULSE      RIGHT                                       POSTERIOR TIBIAL  monophasic by Doppler  monophasic by Doppler       DORSALIS PEDIS      ANTERIOR TIBIAL   biphasic by Doppler  palpable    Non-Invasive Vascular Imaging ABI'S: pending  Assessment/Plan: Lindsay [name] is a 67 y.o. female who is 1 Day Post-Op Procedure(s):  LEFT OOPHORECTOMY AORTObifem BYPASS Hypotension - hemodynamics WNL will give NS bolus Hyperglycemia in pt not previously DM - on sliding scale will take dextrose out of IVF Check Hgb A1C CXR this am Will change pain meds Vent per CCM       ROCZNIAK,REGINA J [phone] 06/18/2012 7:48 AM   Agree with Regina's note above.  Most likely hypotension is hypovolemia from third spacing, hemoglobin stable overnight Will continue to fluid challenge today and wean dopamine off Palpable DP pulses bilaterally Just extubated Will need to follow up path on ovary Keep NG Remove all IV's and aline from left arm which has some edema Will d/c swan sleeve tomorrow if stable  Fabienne Bruns, MD [facility] [place] Office: [phone] Pager: [phone]  Up to chair today

## 2012-06-18 NOTE — Procedures (Signed)
Extubation Procedure Note  Patient Details:   Name: Lindsay [name] DOB: 1946/01/18 MRN: [id number]   Airway Documentation:     Evaluation  O2 sats: stable throughout Complications: No apparent complications Patient did tolerate procedure well. Bilateral Breath Sounds: Diminished Suctioning: Airway Yes Cuff leak positive  Newt Lukes 06/18/2012, 8:40 AM

## 2012-06-18 NOTE — Evaluation (Signed)
Physical Therapy Evaluation Patient Details Name: Lindsay [name] MRN: [id number] DOB: 1945-10-14 Today's Date: 06/18/2012 Time: 0930-1004 PT Time Calculation (min): 34 min  PT Assessment / Plan / Recommendation Clinical Impression  Pt s/p aortobifemoral BPG with decr mobility secondary to decr endurance, decr balance and incr pain.  Will benefit from PT to address endurance, balance and activity tolerance.  May need Rehab.  Need to see how pt does over next few days.      PT Assessment  Patient needs continued PT services    Follow Up Recommendations  CIR;Supervision/Assistance - 24 hour    Does the patient have the potential to tolerate intense rehabilitation    yes          Equipment Recommendations  Rolling walker with 5" wheels    Recommendations for Other Services Rehab consult   Frequency Min 3X/week    Precautions / Restrictions Precautions Precautions: Fall Restrictions Weight Bearing Restrictions: No   Pertinent Vitals/Pain BP down to 77/41 5 minutes after PT assisted to chair.  Nursing notified and gave pt meds to address BP.  7/10 pain abdomen.     Mobility  Bed Mobility Bed Mobility: Rolling Right;Right Sidelying to Sit;Sitting - Scoot to [place] of Bed Rolling Right: 1: +2 Total assist Rolling Right: Patient Percentage: 30% Right Sidelying to Sit: 1: +2 Total assist Right Sidelying to Sit: Patient Percentage: 30% Sitting - Scoot to Edge of Bed: 2: Max assist Details for Bed Mobility Assistance: Pt needed assist to move LES off bed as well as for elevation of trunk.  Pt needed assist using pad to scoot pt to EOB as she could not scoot out on her own.   Transfers Transfers: Sit to Stand;Stand to Sit;Stand Pivot Transfers Sit to Stand: 1: +2 Total assist;With upper extremity assist;From bed Sit to Stand: Patient Percentage: 50% Stand to Sit: 1: +2 Total assist;With upper extremity assist;With armrests;To chair/3-in-1 Stand to Sit: Patient Percentage:  50% Stand Pivot Transfers: 1: +2 Total assist Stand Pivot Transfers: Patient Percentage: 60% Details for Transfer Assistance: Pt needed incr assist to intitiate sit to stand.  Once up, pt able to stand pivot around to recliner with assist and cues to weight shift.  Pt with semi-upright posture secondary to pain.   Ambulation/Gait Ambulation/Gait Assistance: Not tested (comment) Stairs: No Wheelchair Mobility Wheelchair Mobility: No         PT Diagnosis: Generalized weakness  PT Problem List: Decreased activity tolerance;Decreased balance;Decreased mobility;Decreased knowledge of use of DME;Decreased safety awareness;Decreased knowledge of precautions;Pain PT Treatment Interventions: DME instruction;Gait training;Functional mobility training;Therapeutic activities;Therapeutic exercise;Balance training;Neuromuscular re-education;Stair training;Patient/family education   PT Goals Acute Rehab PT Goals PT Goal Formulation: With patient Time For Goal Achievement: 07/02/12 Potential to Achieve Goals: Good Pt will go Supine/Side to Sit: Independently PT Goal: Supine/Side to Sit - Progress: Goal set today Pt will Sit at Edge of Bed: Independently;3-5 min PT Goal: Sit at Edge Of Bed - Progress: Goal set today Pt will go Sit to Stand: with supervision;with upper extremity assist PT Goal: Sit to Stand - Progress: Goal set today Pt will Transfer Bed to Chair/Chair to Bed: with supervision PT Transfer Goal: Bed to Chair/Chair to Bed - Progress: Goal set today Pt will Ambulate: 51 - 150 feet;with supervision;with least restrictive assistive device PT Goal: Ambulate - Progress: Goal set today Pt will Go Up / Down Stairs: 1-2 stairs;with min assist;with least restrictive assistive device PT Goal: Up/Down Stairs - Progress: Goal set today Pt will Perform  Home Exercise Program: with supervision, verbal cues required/provided PT Goal: Perform Home Exercise Program - Progress: Goal set today  Visit  Information  Last PT Received On: 06/18/12 Assistance Needed: +2 PT/OT Co-Evaluation/Treatment: Yes    Subjective Data  Subjective: "I feel so nauseous." Patient Stated Goal: To go home   Prior Functioning  Home Living Lives With: Spouse;Family Available Help at Discharge: Family;Available 24 hours/day Type of Home: Mobile home Home Access: Stairs to enter Entrance Stairs-Number of Steps: 2 Entrance Stairs-Rails: None Home Layout: One level Bathroom Shower/Tub: Health visitor: Standard Home Adaptive Equipment: Built-in shower seat Prior Function Level of Independence: Independent Communication Communication: No difficulties Dominant Hand: Right    Cognition  Cognition Overall Cognitive Status: Appears within functional limits for tasks assessed/performed Arousal/Alertness: Lethargic Orientation Level: Appears intact for tasks assessed Behavior During Session: Lethargic Cognition - Other Comments: Had a lot of pain medications.  Lethargic throughout    Extremity/Trunk Assessment Right Lower Extremity Assessment RLE ROM/Strength/Tone: Deficits;Due to pain RLE ROM/Strength/Tone Deficits: grossly 3/5 Left Lower Extremity Assessment LLE ROM/Strength/Tone: Deficits;Due to pain LLE ROM/Strength/Tone Deficits: grossly 3/5 Trunk Assessment Trunk Assessment: Normal   Balance Static Sitting Balance Static Sitting - Balance Support: Bilateral upper extremity supported;Feet supported Static Sitting - Level of Assistance: 3: Mod assist Static Sitting - Comment/# of Minutes: Needed mod A initially to sit EOB and progressed to needing min assist after sitting 2 minutes.  Sat a total of 4 minutes and then PT/OT assited pt to chair.  End of Session PT - End of Session Equipment Utilized During Treatment: Gait belt;Oxygen Activity Tolerance: Patient limited by fatigue;Patient limited by pain Patient left: in chair;with call bell/phone within reach;with family/visitor  present Nurse Communication: Mobility status;Need for lift equipment       INGOLD,Wally Behan 06/18/2012, 1:27 PM [facility] Acute Rehabilitation [id number] [phone] (pager)

## 2012-06-18 NOTE — Progress Notes (Signed)
Rehab Admissions Coordinator Note:  Patient was screened by Meryl Dare for appropriateness for an Inpatient Acute Rehab Consult.  At this time, we are recommending Inpatient Rehab consult.  Meryl Dare 06/18/2012, 2:27 PM  I can be reached at [phone].

## 2012-06-18 NOTE — Progress Notes (Addendum)
PULMONARY  / CRITICAL CARE MEDICINE  Name: Lindsay [name] MRN: [id number] DOB: 10/22/1945    ADMISSION DATE:  06/17/2012 CONSULTATION DATE:  06/17/2012  REFERRING MD :  [facility]  CHIEF COMPLAINT:  S/p Aortobifem  BRIEF PATIENT DESCRIPTION: 67 y/o female with abdominal aortic stenosis, COPD who underwent an aorto-bifem and removal of a L adnexal mass on 06/17/2012.  PCCM consulted for post op vent management.  SIGNIFICANT EVENTS / STUDIES:  3/5 aorto-bifem bypass 3/5 removal of L adnexal mass  LINES / TUBES: 3/5 R IJ introducer >> 3/5 ETT >> 3/6 3/5 L radial a-line >> 3/6  CULTURES:  ANTIBIOTICS: Cefuroxime periop >   HISTORY OF PRESENT ILLNESS:   67 y/o female with abdominal aortic stenosis, COPD who underwent an aorto-bifem and removal of a L adnexal mass on 06/17/2012.  PCCM consulted for post op vent management.  She was intubated in the PACU due to a respiratory acidosis.  Per chart review there were no post operative complications.  Her blood pressure was slightly low when returning to 2300.  No further history could be obtained due to intubation.  In the OR she received 2 U PRBC, 5L LR, 500cc NS, and 750 mL albumin.  Her EBL was 1.7L and urine output during the case was adequate.  SUBJECTIVE:  tolerating PSV 5, awake.  Remains on dopa 5 Some abd pain at incision, especially w coughing  VITAL SIGNS: Temp:  [96 F (35.6 C)-98.6 F (37 C)] 98 F (36.7 C) (03/06 0806) Pulse Rate:  [56-83] 83 (03/06 0800) Resp:  [5-21] 19 (03/06 0800) BP: (63-129)/(35-76) 97/42 mmHg (03/06 0800) SpO2:  [94 %-100 %] 96 % (03/06 0800) Arterial Line BP: (72-132)/(25-64) 111/39 mmHg (03/06 0800) FiO2 (%):  [50 %-100 %] 50 % (03/06 0817) Weight:  [58.8 kg (129 lb 10.1 oz)-61.9 kg (136 lb 7.4 oz)] 61.9 kg (136 lb 7.4 oz) (03/06 0630) HEMODYNAMICS: PAP: (35-51)/(8-15) 35/8 mmHg CO:  [2.8 L/min-3.5 L/min] 3.5 L/min CI:  [1.7 L/min/m2-2.1 L/min/m2] 2.1 L/min/m2 VENTILATOR SETTINGS: Vent  Mode:  [-] CPAP;PSV FiO2 (%):  [50 %-100 %] 50 % Set Rate:  [14 bmp-20 bmp] 20 bmp Vt Set:  [500 mL] 500 mL PEEP:  [5 cmH20] 5 cmH20 Pressure Support:  [5 cmH20] 5 cmH20 Plateau Pressure:  [18 cmH20-22 cmH20] 18 cmH20 INTAKE / OUTPUT: Intake/Output     03/05 0701 - 03/06 0700 03/06 0701 - 03/07 0700   I.V. (mL/kg) 7595.5 (122.7) 130.5 (2.1)   Blood 1300    NG/GT 60    IV Piggyback 1170    Total Intake(mL/kg) 10125.5 (163.6) 130.5 (2.1)   Urine (mL/kg/hr) 1060 (0.7) 45 (0.5)   Blood 1700 (1.1)    Total Output 2760 45   Net +7365.5 +85.5          PHYSICAL EXAMINATION: Gen: awake on vent HEENT: NCAT, PERRL, EOMi, ETT in place, periorbital and facial edema PULM: CTA B CV: RRR, no mgr, no JVD AB: BS infrequent, tender near incision site Ext: warm, R DP palpable, L DP noted via doppler Derm: no rash or skin breakdown Neuro: awake and follows commands   LABS:  Recent Labs Lab 06/16/12 1421  06/17/12 1444 06/17/12 1535 06/17/12 2041 06/17/12 2155 06/17/12 2345 06/18/12 0405  HGB 14.6  < > 12.6  --   --  13.7  --  12.5  WBC 10.7*  --  20.7*  --   --  16.2*  --  17.2*  PLT 208  --  113*  --   --  125*  --  126*  NA 139  < > 141  --   --  139  --  140  K 3.7  < > 5.0  --   --  4.2  --  3.9  CL 104  --  113*  --   --  113*  --  115*  CO2 20  --  19  --   --  19  --  19  GLUCOSE 98  < > 141*  --   --  229*  --  228*  BUN 9  --  10  --   --  11  --  13  CREATININE 0.69  --  0.71  --   --  0.84  --  0.91  CALCIUM 9.6  --  6.3*  --   --  6.6*  --  6.8*  MG  --   --  1.2*  --   --  1.2*  --  1.9  AST 23  --   --   --   --  32  --  30  ALT 20  --   --   --   --  14  --  13  ALKPHOS 76  --   --   --   --  32*  --  32*  BILITOT 0.2*  --   --   --   --  0.6  --  0.3  PROT 6.9  --   --   --   --  4.1*  --  3.9*  ALBUMIN 3.5  --   --   --   --  2.4*  --  2.1*  APTT 22*  --  38*  --   --   --   --   --   INR 0.85  --  1.49  --   --   --   --   --   PHART  --   < >  --  7.242*  7.226*  --  7.335*  --   PCO2ART  --   < >  --  44.7 44.9  --  35.6  --   PO2ART  --   < >  --  309.0* 107.0*  --  87.0  --   < > = values in this interval not displayed.  Recent Labs Lab 06/17/12 2351 06/18/12 0338 06/18/12 0812  GLUCAP 194* 203* 172*    3/5 CXR: emphysema, clear lungs, ETT in place 3/5 EKG > NSR, no ST wave changes 3/6 CXR some basilar atx, otherwise no infiltrates  ASSESSMENT / PLAN:  PULMONARY A: Post op respiratory failure COPD without exacerbation, on albuterol prn at home P:   -goal extubate 3/6 -scheduled duonebs + alb prn -consider home with scheduled BD's (? spiriva)  CARDIOVASCULAR A: Abdominal aortic stenosis now s/p aorto-bifem bypass 3/5 Tobacco abuse Post-op hypotension, requiring dopa 3/6 P:  -IVF bolus, wean dopa as tolerated now that sedation lifted -decrease maintenance IVF to 75cc/h, manage BP with boluses -per vascular surgery -educate to quit smoking post op, nicotine patch in place >> decrease to 14mg  on 3/6  RENAL A:  Post op hypocalcemia and hypomagnesemia Metabolic acidosis - non-gap most likely too much Cl intraoperatively P:   -replace electrolytes and follow CMP  GASTROINTESTINAL A:  No acute issues P:   -OG tube -pepcid bid for stress ulcer prophylaxis  HEMATOLOGIC A:  No acute issues;  received 2 U PRBC intraoperatively P:  -follow cbc  INFECTIOUS A:  No acute issues P:   -monitor temp curve  ENDOCRINE A:  Post op hyperglycemia P:   -CBG monitoring and SSI  NEUROLOGIC A:  Post op pain and sedation needs for vent synchrony P:   -careful with narcs, BP has been sensitive  GYNE: A: Adnexal mass removed by Gyn-onc in OR 3/5 P: -per gyne-onc  TODAY'S SUMMARY: 67 y/o female with COPD who is s/p aorto-bifem bypass on 3/5.  Extubated 3/6.   I have personally obtained a history, examined the patient, evaluated laboratory and imaging results, formulated the assessment and plan and placed orders. CRITICAL  CARE: The patient is critically ill with multiple organ systems failure and requires high complexity decision making for assessment and support, frequent evaluation and titration of therapies, application of advanced monitoring technologies and extensive interpretation of multiple databases. Critical Care Time devoted to patient care services described in this note is 35 minutes.   Levy Pupa, MD, PhD 06/18/2012, 8:44 AM [facility] [phone] or if no answer [phone]

## 2012-06-19 ENCOUNTER — Inpatient Hospital Stay (HOSPITAL_COMMUNITY): Payer: Medicare PPO

## 2012-06-19 DIAGNOSIS — I70219 Atherosclerosis of native arteries of extremities with intermittent claudication, unspecified extremity: Secondary | ICD-10-CM

## 2012-06-19 LAB — MAGNESIUM: Magnesium: 2 mg/dL (ref 1.5–2.5)

## 2012-06-19 LAB — CBC
Hemoglobin: 11.5 g/dL — ABNORMAL LOW (ref 12.0–15.0)
MCH: 28.9 pg (ref 26.0–34.0)
MCHC: 33.5 g/dL (ref 30.0–36.0)
MCV: 86.2 fL (ref 78.0–100.0)

## 2012-06-19 LAB — BASIC METABOLIC PANEL
BUN: 15 mg/dL (ref 6–23)
CO2: 20 mEq/L (ref 19–32)
Calcium: 7.5 mg/dL — ABNORMAL LOW (ref 8.4–10.5)
Creatinine, Ser: 0.86 mg/dL (ref 0.50–1.10)
GFR calc Af Amer: 79 mL/min — ABNORMAL LOW (ref >90)

## 2012-06-19 LAB — GLUCOSE, CAPILLARY: Glucose-Capillary: 110 mg/dL — ABNORMAL HIGH (ref 70–99)

## 2012-06-19 MED ORDER — IPRATROPIUM BROMIDE 0.02 % IN SOLN
0.5000 mg | Freq: Three times a day (TID) | RESPIRATORY_TRACT | Status: DC
  Administered 2012-06-19 – 2012-06-26 (×21): 0.5 mg via RESPIRATORY_TRACT
  Filled 2012-06-19 (×22): qty 2.5

## 2012-06-19 MED ORDER — ALBUTEROL SULFATE (5 MG/ML) 0.5% IN NEBU
2.5000 mg | INHALATION_SOLUTION | Freq: Three times a day (TID) | RESPIRATORY_TRACT | Status: DC
  Administered 2012-06-19 – 2012-06-26 (×21): 2.5 mg via RESPIRATORY_TRACT
  Filled 2012-06-19 (×22): qty 0.5

## 2012-06-19 MED ORDER — DIPHENHYDRAMINE HCL 12.5 MG/5ML PO ELIX
12.5000 mg | ORAL_SOLUTION | Freq: Four times a day (QID) | ORAL | Status: DC | PRN
  Filled 2012-06-19: qty 5

## 2012-06-19 MED ORDER — DIPHENHYDRAMINE HCL 50 MG/ML IJ SOLN: 12.5000 mg | Freq: Four times a day (QID) | INTRAMUSCULAR | Status: DC | PRN

## 2012-06-19 MED ORDER — FUROSEMIDE 10 MG/ML IJ SOLN
20.0000 mg | Freq: Once | INTRAMUSCULAR | Status: AC
  Administered 2012-06-19: 20 mg via INTRAVENOUS
  Filled 2012-06-19: qty 2

## 2012-06-19 MED ORDER — SODIUM CHLORIDE 0.9 % IJ SOLN: 9.0000 mL | INTRAMUSCULAR | Status: DC | PRN

## 2012-06-19 MED ORDER — NALOXONE HCL 0.4 MG/ML IJ SOLN: 0.4000 mg | INTRAMUSCULAR | Status: DC | PRN

## 2012-06-19 MED ORDER — FENTANYL 10 MCG/ML IV SOLN
INTRAVENOUS | Status: DC
  Administered 2012-06-19: 20 ug via INTRAVENOUS
  Administered 2012-06-19: 30 ug via INTRAVENOUS
  Administered 2012-06-19: 15:00:00 via INTRAVENOUS
  Administered 2012-06-20: 110 ug via INTRAVENOUS
  Administered 2012-06-20: 70 ug via INTRAVENOUS
  Administered 2012-06-20: 15:00:00 via INTRAVENOUS
  Administered 2012-06-20: 150 ug via INTRAVENOUS
  Administered 2012-06-20 (×2): 90 ug via INTRAVENOUS
  Administered 2012-06-20: 60 ug via INTRAVENOUS
  Administered 2012-06-21: 50 ug via INTRAVENOUS
  Administered 2012-06-21: 170 ug via INTRAVENOUS
  Administered 2012-06-21: 50 ug via INTRAVENOUS
  Administered 2012-06-21: 200 ug via INTRAVENOUS
  Filled 2012-06-19 (×3): qty 50

## 2012-06-19 NOTE — Progress Notes (Signed)
Physical Therapy Treatment Patient Details Name: ADRIANAH PROPHETE MRN: [id number] DOB: 1946/03/16 Today's Date: 06/19/2012 Time: 8295-6213 PT Time Calculation (min): 24 min  PT Assessment / Plan / Recommendation Comments on Treatment Session  Pt s/p aortobifemoral BPG with decr mobility secondary to decr endurance, incr pain and poor balance.  Will benefit from PT to address endurance and balance issues.      Follow Up Recommendations  CIR;Supervision/Assistance - 24 hour                 Equipment Recommendations  Rolling walker with 5" wheels    Recommendations for Other Services Rehab consult  Frequency Min 3X/week   Plan Discharge plan remains appropriate;Frequency remains appropriate    Precautions / Restrictions Precautions Precautions: Fall Restrictions Weight Bearing Restrictions: No   Pertinent Vitals/Pain See doc flowsheet    Mobility  Bed Mobility Bed Mobility: Rolling Right;Right Sidelying to Sit;Sitting - Scoot to [place] of Bed Rolling Right: 1: +2 Total assist Rolling Right: Patient Percentage: 40% Right Sidelying to Sit: 1: +2 Total assist;HOB elevated Right Sidelying to Sit: Patient Percentage: 30% Sitting - Scoot to Edge of Bed: 2: Max assist Details for Bed Mobility Assistance: Pt needed assist to move LES off bed as well as for elevation of trunk.  Pt needed assist using pad to scoot pt to EOB as she could not scoot out on her own.   Transfers Transfers: Sit to Stand;Stand to Sit Sit to Stand: 1: +2 Total assist;With upper extremity assist;From bed Sit to Stand: Patient Percentage: 70% Stand to Sit: 1: +2 Total assist;With upper extremity assist;To bed Stand to Sit: Patient Percentage: 60% Stand Pivot Transfers: Not tested (comment) Details for Transfer Assistance: Pt needed less assist to stand.  Pt stood unexpectedly once PT got her to EOB.  Pt was holding pillow to abdomen and cued pt for safety.   Ambulation/Gait Ambulation/Gait Assistance: 1: +2  Total assist Ambulation/Gait: Patient Percentage: 70% Ambulation Distance (Feet): 4 Feet Assistive device: Rolling walker Ambulation/Gait Assistance Details: Pt placed her left hand on RW and began taking steps.  Instructed pt to slow down and pt stated she wanted to get the walk done.  Pt began coughing and PT got her pillow to hug.  She stepped backward with PT asssiting with guiding RW  backwards.  Pt needed cues for safety and sequencing steps and RW.  HR increased as well as RR.  Coughed up a bunch of phlegm which PT suctioned out.  Then PT assisted pt to supine secodnary to high HR and DOE 3/4.  Nursing notified.  Took BP and BP was 63/45.  Nursing in room.  Pt not symptomatic on PT departure.   Gait Pattern: Step-to pattern;Shuffle;Trunk flexed;Decreased step length - left;Decreased step length - right Gait velocity: decreased Stairs: No Wheelchair Mobility Wheelchair Mobility: No    PT Goals Acute Rehab PT Goals PT Goal: Supine/Side to Sit - Progress: Progressing toward goal PT Goal: Sit at Edge Of Bed - Progress: Progressing toward goal PT Goal: Sit to Stand - Progress: Progressing toward goal PT Transfer Goal: Bed to Chair/Chair to Bed - Progress: Progressing toward goal PT Goal: Ambulate - Progress: Progressing toward goal  Visit Information  Last PT Received On: 06/19/12 Assistance Needed: +2    Subjective Data  Subjective: "I am so nauseous."   Cognition  Cognition Overall Cognitive Status: Appears within functional limits for tasks assessed/performed Arousal/Alertness: Awake/alert Orientation Level: Appears intact for tasks assessed Behavior During Session: [facility] for  tasks performed    Balance  Static Sitting Balance Static Sitting - Balance Support: Bilateral upper extremity supported;Feet supported Static Sitting - Level of Assistance: 4: Min assist;3: Mod assist Static Sitting - Comment/# of Minutes: Needed mod assist initially to sit EOB due to pain and progressed  to needing min assist after sitting 2 minutes.  Sat a total of 4 minutes.    End of Session PT - End of Session Equipment Utilized During Treatment: Gait belt;Oxygen Activity Tolerance: Patient limited by fatigue;Patient limited by pain Patient left: in bed;with call bell/phone within reach;with nursing in room Nurse Communication: Mobility status;Need for lift equipment        INGOLD,DAWN 06/19/2012, 12:32 PM [facility] Acute Rehabilitation [phone] (pager)

## 2012-06-19 NOTE — Op Note (Signed)
Procedure: Change right jugular sheath for triple lumen Details: Sterile prep. J wire passed down pre existing right IJ sheath without difficulty.  Sheath removed.  Triple lumen placed over wire to 15 cm.  Sutured to skin 1% Lidocaine.  No complications.  Chest xray pending.  Fabienne Bruns, MD [facility] [place] Office: [phone] Pager: [phone]

## 2012-06-19 NOTE — Progress Notes (Signed)
VASCULAR LAB PRELIMINARY  ARTERIAL  ABI completed:    RIGHT    LEFT    PRESSURE WAVEFORM  PRESSURE WAVEFORM  BRACHIAL 126 Triphasic BRACHIAL 121 Triphasic  DP 107 Triphasic DP 89 BIphasic         PT 109 Triphasic PT 90 Triphasic                  RIGHT LEFT  ABI 0.87 0.71   ABIs indicate normal arterial flow on the right and a moderate reduction on the left.  SLAUGHTER, [place], RVS 06/19/2012, 4:02 PM

## 2012-06-19 NOTE — Progress Notes (Signed)
PULMONARY  / CRITICAL CARE MEDICINE  Name: Lindsay [name] MRN: [id number] DOB: 1945/10/29    ADMISSION DATE:  06/17/2012 CONSULTATION DATE:  06/17/2012  REFERRING MD :  [facility]  CHIEF COMPLAINT:  S/p Aortobifem  BRIEF PATIENT DESCRIPTION: 67 y/o female with abdominal aortic stenosis, COPD who underwent an aorto-bifem and removal of a L adnexal mass on 06/17/2012.  PCCM consulted for post op vent management.  SIGNIFICANT EVENTS / STUDIES:  3/5 aorto-bifem bypass 3/5 removal of L adnexal mass  LINES / TUBES: 3/5 R IJ introducer >> 3/5 ETT >> 3/6 3/5 L radial a-line >> 3/6  CULTURES:  ANTIBIOTICS: Cefuroxime periop > off  HISTORY OF PRESENT ILLNESS:   67 y/o female with abdominal aortic stenosis, COPD who underwent an aorto-bifem and removal of a L adnexal mass on 06/17/2012.  PCCM consulted for post op vent management.  She was intubated in the PACU due to a respiratory acidosis.  Per chart review there were no post operative complications.  Her blood pressure was slightly low when returning to 2300.  No further history could be obtained due to intubation.  In the OR she received 2 U PRBC, 5L LR, 500cc NS, and 750 mL albumin.  Her EBL was 1.7L and urine output during the case was adequate.  SUBJECTIVE:  Dopa weaned to off Tolerated extubation, although splinting and tachypneic when her pain increases, has had some associated hypoxemia.   VITAL SIGNS: Temp:  [97.7 F (36.5 C)-99.6 F (37.6 C)] 99.6 F (37.6 C) (03/07 1123) Pulse Rate:  [90-132] 113 (03/07 1200) Resp:  [10-22] 18 (03/07 1200) BP: (79-124)/(34-98) 120/61 mmHg (03/07 1200) SpO2:  [82 %-100 %] 96 % (03/07 1200) Weight:  [71.8 kg (158 lb 4.6 oz)] 71.8 kg (158 lb 4.6 oz) (03/07 0400) HEMODYNAMICS:   VENTILATOR SETTINGS:   INTAKE / OUTPUT: Intake/Output     03/06 0701 - 03/07 0700 03/07 0701 - 03/08 0700   I.V. (mL/kg) 1851 (25.8) 375 (5.2)   Blood     NG/GT 90    IV Piggyback 100 50   Total  Intake(mL/kg) 2041 (28.4) 425 (5.9)   Urine (mL/kg/hr) 1635 (0.9) 235 (0.6)   Emesis/NG output 150 (0.1)    Blood     Total Output 1785 235   Net +256 +190          PHYSICAL EXAMINATION: Gen: awake, uncomfortable up to chair HEENT: NCAT, PERRL, EOMi, PULM: CTA B, decreased at bases CV: RRR, no mgr, no JVD AB: BS infrequent, tender near incision site Ext: warm, R DP palpable, L DP noted via doppler Derm: no rash or skin breakdown Neuro: awake and follows commands   LABS:  Recent Labs Lab 06/16/12 1421  06/17/12 1444 06/17/12 1535 06/17/12 2041 06/17/12 2155 06/17/12 2345 06/18/12 0405 06/19/12 0345  HGB 14.6  < > 12.6  --   --  13.7  --  12.5 11.5*  WBC 10.7*  --  20.7*  --   --  16.2*  --  17.2* 17.4*  PLT 208  --  113*  --   --  125*  --  126* 122*  NA 139  < > 141  --   --  139  --  140 144  K 3.7  < > 5.0  --   --  4.2  --  3.9 4.2  CL 104  --  113*  --   --  113*  --  115* 116*  CO2 20  --  19  --   --  19  --  19 20  GLUCOSE 98  < > 141*  --   --  229*  --  228* 121*  BUN 9  --  10  --   --  11  --  13 15  CREATININE 0.69  --  0.71  --   --  0.84  --  0.91 0.86  CALCIUM 9.6  --  6.3*  --   --  6.6*  --  6.8* 7.5*  MG  --   < > 1.2*  --   --  1.2*  --  1.9 2.0  PHOS  --   --   --   --   --   --   --   --  2.4  AST 23  --   --   --   --  32  --  30  --   ALT 20  --   --   --   --  14  --  13  --   ALKPHOS 76  --   --   --   --  32*  --  32*  --   BILITOT 0.2*  --   --   --   --  0.6  --  0.3  --   PROT 6.9  --   --   --   --  4.1*  --  3.9*  --   ALBUMIN 3.5  --   --   --   --  2.4*  --  2.1*  --   APTT 22*  --  38*  --   --   --   --   --   --   INR 0.85  --  1.49  --   --   --   --   --   --   PHART  --   < >  --  7.242* 7.226*  --  7.335*  --   --   PCO2ART  --   < >  --  44.7 44.9  --  35.6  --   --   PO2ART  --   < >  --  309.0* 107.0*  --  87.0  --   --   < > = values in this interval not displayed.  Recent Labs Lab 06/18/12 1625 06/18/12 1928  06/18/12 2318 06/19/12 0321 06/19/12 0721  GLUCAP 83 96 102* 110* 116*    3/5 CXR: emphysema, clear lungs, ETT in place 3/5 EKG > NSR, no ST wave changes 3/6 CXR some basilar atx, otherwise no infiltrates  ASSESSMENT / PLAN:  PULMONARY A: Post op respiratory failure, improved but pain still limiting resp status. Suspect CXR from 3/7 reflects atelectasis, not pulm edema.  COPD without exacerbation, on albuterol prn at home P:   -agree with PCA to allow better pulm toilet; careful to avoid oversedation -scheduled duonebs + alb prn -consider home with scheduled BD's (? Spiriva) -would hold off on any further diuresis, CXR reflects poor resp effort and atx. She has just gotten off dopa through volume replacement, suspect euvolemic  CARDIOVASCULAR A: Abdominal aortic stenosis now s/p aorto-bifem bypass 3/5 Tobacco abuse Post-op hypotension, requiring dopa 3/6 > resolved P:  - KVO IVF -per vascular surgery -educate to quit smoking post op, nicotine patch in place >> decreased to 14mg  on 3/6  RENAL A:  Post op hypocalcemia and hypomagnesemia Metabolic acidosis - non-gap most likely  too much Cl intraoperatively P:   -replace electrolytes and follow CMP  GASTROINTESTINAL A:  No acute issues P:   -pepcid bid for stress ulcer prophylaxis -diet when OK w VVS  HEMATOLOGIC A:  No acute issues; received 2 U PRBC intraoperatively P:  -follow cbc  INFECTIOUS A:  No acute issues P:   -monitor temp curve  ENDOCRINE A:  Post op hyperglycemia P:   -CBG monitoring and SSI  NEUROLOGIC A:  Post op pain P:   -careful with narcs, BP has been sensitive. Need the low dose PCA to allow good resp effort  GYNE: A: Adnexal mass removed by Gyn-onc in OR 3/5 P: -per gyne-onc  TODAY'S SUMMARY: 67 y/o female with COPD who is s/p aorto-bifem bypass on 3/5.  Extubated 3/6.   I have personally obtained a history, examined the patient, evaluated laboratory and imaging results,  formulated the assessment and plan and placed orders.  PCCM will sign off. Please call if we can help you.   Levy Pupa, MD, PhD 06/19/2012, 12:04 PM [facility] [phone] or if no answer [phone]

## 2012-06-19 NOTE — Progress Notes (Addendum)
[facility] [place]  Post-op  Intra-abdominal Surgery note  Date of Surgery: 06/17/2012  Surgeon(s): Sherren Kerns, MD Dara Lords, MD  2 Days Post-Op Procedure(s):  LEFT OOPHORECTOMY AORTObyfem BYPASS AORTIC ENDARTERECETOMY  History of Present Illness  Lindsay [name] is a 67 y.o. female who is  2 days post -opshe is off vent sats low 90's on maskcomplains of incisional pain; denies nausea/vomiting; denies diarrhea. has not had flatus;has not had BM  IMAGING: Dg Chest Port 1 View  06/18/2012  *RADIOLOGY REPORT*  Clinical Data: Post open abdominal surgery, on ventilator  PORTABLE CHEST - 1 VIEW  Comparison: Portable chest x-ray of 06/17/2012  Findings: The tip of the endotracheal tube is approximately 5.0 cm above the carina.  The Swan-Ganz catheter has been removed and a venous sheath remains.  No pneumothorax is seen.  There is cardiomegaly present with bibasilar atelectasis left greater than right.  Small effusions cannot be excluded.  IMPRESSION:  1.  Swan-Ganz catheter removed. 2.  No change in position endotracheal tube. 3.  Slight increase in basilar atelectasis left greater than right. Cannot exclude small effusions.   Original Report Authenticated By: Dwyane Dee, M.D.    Dg Chest Portable 1 View  06/17/2012  *RADIOLOGY REPORT*  Clinical Data: Evaluate endotracheal tube and Swan-Ganz catheter placement  PORTABLE CHEST - 1 VIEW  Comparison: Chest x-ray of 06/16/2012  Findings: The tip of the endotracheal tube is approximately 4.2 cm above the carina.  The Swan-Ganz catheter coils within the right atrium with the tip extending toward the right ventricle.  No pneumothorax is seen.  The lungs appear relatively well aerated. Heart size is stable.  IMPRESSION:  1.  Tip of endotracheal tube 4.2 cm above the carina. 2.  Swan-Ganz catheter coils in the right atrium with the tip extending toward the right ventricle.   Original Report Authenticated By: Dwyane Dee, M.D.      Significant Diagnostic Studies: CBC Lab Results  Component Value Date   WBC 17.4* 06/19/2012   HGB 11.5* 06/19/2012   HCT 34.3* 06/19/2012   MCV 86.2 06/19/2012   PLT 122* 06/19/2012    BMET    Component Value Date/Time   NA 144 06/19/2012 0345   K 4.2 06/19/2012 0345   CL 116* 06/19/2012 0345   CO2 20 06/19/2012 0345   GLUCOSE 121* 06/19/2012 0345   BUN 15 06/19/2012 0345   CREATININE 0.86 06/19/2012 0345   CALCIUM 7.5* 06/19/2012 0345   GFRNONAA 68* 06/19/2012 0345   GFRAA 79* 06/19/2012 0345    COAG Lab Results  Component Value Date   INR 1.49 06/17/2012   INR 0.85 06/16/2012   No results found for this basename: PTT    I/O last 3 completed shifts: In: 3917.7 [I.V.:3437.7; NG/GT:120; IV Piggyback:360] Out: 2360 [Urine:2210; Emesis/NG output:150]    Physical Examination BP Readings from Last 3 Encounters:  06/19/12 119/45  06/19/12 119/45  06/19/12 119/45   Temp Readings from Last 3 Encounters:  06/19/12 98.9 F (37.2 C) Oral  06/19/12 98.9 F (37.2 C) Oral  06/19/12 98.9 F (37.2 C) Oral   SpO2 Readings from Last 3 Encounters:  06/19/12 93%  06/19/12 93%  06/19/12 93%   Pulse Readings from Last 3 Encounters:  06/19/12 115  06/19/12 115  06/19/12 115    General: A&O x 3, WDWN female in NAD Pulmonary: normal non-labored breathing , without Rales, rhonchi,  wheezing Cardiac: Heart rate : regular, ST ,  Abdomen:abdomen  soft Abdominal wound:clean, dry, intact  Neurologic: A&O X 3; Appropriate Affect ;  SENSATION: normal;  MOTOR FUNCTION:  moving all extremities equally.  Speech is fluent/normal  Vascular Exam:BLE warm and well perfused Extremities without ischemic changes, no Gangrene, no cellulitis; no open wounds;   LOWER EXTREMITY                    RIGHT                   LEFT      DORSALIS PEDIS      ANTERIOR TIBIAL  palpable  palpable    Non-Invasive Vascular Imaging ABI'S: pend  Assessment/Plan: Lindsay [name] is a 67 y.o. female who is 2 Days Post-Op   SBP holding above 100 with dopamine off Mod incisional pain - will try reduced dose PCA CXR mild pulm cong - will give lasix if OK with CCM PT/OT NGT 150 last shift still dark green, no flatus or BM Will leave foley today for strict I/O with Diuresis      Marlowe Shores [phone] 06/19/2012 7:48 AM  No IV access currently Will try to change out sleeve for triple lumen later today Otherwise as above  Fabienne Bruns, MD [facility] [place] Office: [phone] Pager: [phone]

## 2012-06-20 LAB — GLUCOSE, CAPILLARY: Glucose-Capillary: 88 mg/dL (ref 70–99)

## 2012-06-20 LAB — CBC
HCT: 29.5 % — ABNORMAL LOW (ref 36.0–46.0)
Hemoglobin: 9.8 g/dL — ABNORMAL LOW (ref 12.0–15.0)
MCH: 28.9 pg (ref 26.0–34.0)
MCHC: 33.2 g/dL (ref 30.0–36.0)
MCV: 87 fL (ref 78.0–100.0)

## 2012-06-20 LAB — BASIC METABOLIC PANEL
BUN: 16 mg/dL (ref 6–23)
CO2: 26 mEq/L (ref 19–32)
Chloride: 115 mEq/L — ABNORMAL HIGH (ref 96–112)
Glucose, Bld: 105 mg/dL — ABNORMAL HIGH (ref 70–99)
Potassium: 3.6 mEq/L (ref 3.5–5.1)

## 2012-06-20 MED ORDER — POTASSIUM CHLORIDE 10 MEQ/50ML IV SOLN
INTRAVENOUS | Status: AC
  Administered 2012-06-20: 10 meq via INTRAVENOUS
  Filled 2012-06-20: qty 100

## 2012-06-20 MED ORDER — FUROSEMIDE 10 MG/ML IJ SOLN
40.0000 mg | Freq: Once | INTRAMUSCULAR | Status: AC
  Administered 2012-06-20: 40 mg via INTRAVENOUS
  Filled 2012-06-20: qty 4

## 2012-06-20 MED ORDER — POTASSIUM CHLORIDE 10 MEQ/100ML IV SOLN
10.0000 meq | INTRAVENOUS | Status: AC
  Administered 2012-06-20 (×4): 10 meq via INTRAVENOUS
  Filled 2012-06-20: qty 400

## 2012-06-20 MED ORDER — POTASSIUM CHLORIDE 10 MEQ/50ML IV SOLN: 10.0000 meq | INTRAVENOUS | Status: AC

## 2012-06-20 NOTE — Progress Notes (Signed)
Attempted x 2 to call family member DeAnna without success

## 2012-06-20 NOTE — Progress Notes (Signed)
[facility] [place]  Subjective  - Less pain   Objective 101/49 95 98.4 F (36.9 C) (Oral) 13 95%  Intake/Output Summary (Last 24 hours) at 06/20/12 1046 Last data filed at 06/20/12 1000  Gross per 24 hour  Intake    673 ml  Output   2505 ml  Net  -1832 ml   Abdomen: soft incision healing, no flatus Extremities: 2+ DP pulses feet pink and warm, groin incisions healing no drainage Neuro: alert and oriented Pulmonary: productive cough  Assessment/Planning: S/p aortobifem Ileus persists hopefully NG out tomorrow Continue to diurese Blood loss anemia stable Leukocytosis improving most likely inflammatory OOB ambulate  Lindsay [name],Lindsay [name] 06/20/2012 10:46 AM --  Laboratory Lab Results:  Recent Labs  06/19/12 0345 06/20/12 0420  WBC 17.4* 12.5*  HGB 11.5* 9.8*  HCT 34.3* 29.5*  PLT 122* 123*   BMET  Recent Labs  06/19/12 0345 06/20/12 0420  NA 144 144  K 4.2 3.6  CL 116* 115*  CO2 20 26  GLUCOSE 121* 105*  BUN 15 16  CREATININE 0.86 0.82  CALCIUM 7.5* 7.9*    COAG Lab Results  Component Value Date   INR 1.49 06/17/2012   INR 0.85 06/16/2012   No results found for this basename: PTT

## 2012-06-20 NOTE — Progress Notes (Signed)
Pt's OOT daughter called, update given

## 2012-06-21 LAB — TYPE AND SCREEN
Unit division: 0
Unit division: 0

## 2012-06-21 LAB — BASIC METABOLIC PANEL
BUN: 21 mg/dL (ref 6–23)
CO2: 26 mEq/L (ref 19–32)
Calcium: 8.3 mg/dL — ABNORMAL LOW (ref 8.4–10.5)
GFR calc non Af Amer: 85 mL/min — ABNORMAL LOW (ref >90)
Glucose, Bld: 87 mg/dL (ref 70–99)
Potassium: 4 mEq/L (ref 3.5–5.1)
Sodium: 144 mEq/L (ref 135–145)

## 2012-06-21 LAB — GLUCOSE, CAPILLARY
Glucose-Capillary: 88 mg/dL (ref 70–99)
Glucose-Capillary: 98 mg/dL (ref 70–99)
Glucose-Capillary: 77 mg/dL (ref 70–99)
Glucose-Capillary: 71 mg/dL (ref 70–99)
Glucose-Capillary: 78 mg/dL (ref 70–99)

## 2012-06-21 MED ORDER — POTASSIUM CHLORIDE 10 MEQ/100ML IV SOLN
10.0000 meq | INTRAVENOUS | Status: AC
  Administered 2012-06-21 (×3): 10 meq via INTRAVENOUS
  Filled 2012-06-21: qty 300

## 2012-06-21 MED ORDER — FENTANYL 10 MCG/ML IV SOLN
INTRAVENOUS | Status: DC
  Administered 2012-06-21: 20:00:00 via INTRAVENOUS
  Administered 2012-06-21: 160 ug via INTRAVENOUS
  Administered 2012-06-21: 110 ug via INTRAVENOUS
  Administered 2012-06-21: 230 ug via INTRAVENOUS
  Administered 2012-06-22: 130 ug via INTRAVENOUS
  Administered 2012-06-22: 110 ug via INTRAVENOUS
  Administered 2012-06-22: 22:00:00 via INTRAVENOUS
  Administered 2012-06-22: 80 ug via INTRAVENOUS
  Administered 2012-06-22: 180 ug via INTRAVENOUS
  Administered 2012-06-22: 280 ug via INTRAVENOUS
  Administered 2012-06-22: 170 ug via INTRAVENOUS
  Administered 2012-06-23: 160 ug via INTRAVENOUS
  Administered 2012-06-23: 170 ug via INTRAVENOUS
  Filled 2012-06-21 (×3): qty 50

## 2012-06-21 MED ORDER — FUROSEMIDE 10 MG/ML IJ SOLN
40.0000 mg | Freq: Once | INTRAMUSCULAR | Status: AC
  Administered 2012-06-21: 40 mg via INTRAVENOUS
  Filled 2012-06-21: qty 4

## 2012-06-21 NOTE — Progress Notes (Signed)
[facility] [place]  Subjective  - Feels hungry, less sore   Objective 128/62 97 97.9 F (36.6 C) (Oral) 19 96%  Intake/Output Summary (Last 24 hours) at 06/21/12 1456 Last data filed at 06/21/12 1400  Gross per 24 hour  Intake    775 ml  Output   1510 ml  Net   -735 ml  NG minimal  Abdomen soft incision healing Groins no drainage   Assessment/Planning: Ileus resolving NG out today, clears tomorrow if no nausea or later today if BM Acute blood loss anemia asymptomatic Continue to diurese  Lindsay [name],Lindsay [name] 06/21/2012 2:56 PM --  Laboratory Lab Results:  Recent Labs  06/19/12 0345 06/20/12 0420  WBC 17.4* 12.5*  HGB 11.5* 9.8*  HCT 34.3* 29.5*  PLT 122* 123*   BMET  Recent Labs  06/20/12 0420 06/21/12 0500  NA 144 144  K 3.6 4.0  CL 115* 110  CO2 26 26  GLUCOSE 105* 87  BUN 16 21  CREATININE 0.82 0.74  CALCIUM 7.9* 8.3*    COAG Lab Results  Component Value Date   INR 1.49 06/17/2012   INR 0.85 06/16/2012   No results found for this basename: PTT

## 2012-06-22 LAB — GLUCOSE, CAPILLARY: Glucose-Capillary: 74 mg/dL (ref 70–99)

## 2012-06-22 LAB — BASIC METABOLIC PANEL
CO2: 29 mEq/L (ref 19–32)
Calcium: 8.2 mg/dL — ABNORMAL LOW (ref 8.4–10.5)
Creatinine, Ser: 0.75 mg/dL (ref 0.50–1.10)
GFR calc Af Amer: >90 mL/min (ref >90)
Sodium: 143 mEq/L (ref 135–145)

## 2012-06-22 MED ORDER — LORAZEPAM 1 MG PO TABS
1.0000 mg | ORAL_TABLET | Freq: Two times a day (BID) | ORAL | Status: DC
  Administered 2012-06-22: 1 mg via ORAL
  Filled 2012-06-22 (×2): qty 1

## 2012-06-22 MED ORDER — FUROSEMIDE 10 MG/ML IJ SOLN
40.0000 mg | Freq: Once | INTRAMUSCULAR | Status: AC
  Administered 2012-06-22: 40 mg via INTRAVENOUS
  Filled 2012-06-22: qty 4

## 2012-06-22 NOTE — Progress Notes (Addendum)
[facility] [place]  Post-op  Intra-abdominal Surgery note  Date of Surgery: 06/17/2012  Surgeon(s): Sherren Kerns, MD Dara Lords, MD  5 Days Post-Op Procedure(s):  LEFT OOPHORECTOMY AORTOILIAC BYPASS AORTIC ENDARTERECETOMY  History of Present Illness  Lindsay [name] is a 67 y.o. female who is  up s/p Procedure(s):  LEFT OOPHORECTOMY AORTOILIAC BYPASS AORTIC ENDARTERECETOMY Pt is doing well. complains of incisional pain; denies nausea/vomiting; denies diarrhea. has had flatus;has not had BM  IMAGING: No results found.  Significant Diagnostic Studies: CBC Lab Results  Component Value Date   WBC 12.5* 06/20/2012   HGB 9.8* 06/20/2012   HCT 29.5* 06/20/2012   MCV 87.0 06/20/2012   PLT 123* 06/20/2012    BMET    Component Value Date/Time   NA 143 06/22/2012 0354   K 4.0 06/22/2012 0354   CL 107 06/22/2012 0354   CO2 29 06/22/2012 0354   GLUCOSE 77 06/22/2012 0354   BUN 26* 06/22/2012 0354   CREATININE 0.75 06/22/2012 0354   CALCIUM 8.2* 06/22/2012 0354   GFRNONAA 85* 06/22/2012 0354   GFRAA >90 06/22/2012 0354    COAG Lab Results  Component Value Date   INR 1.49 06/17/2012   INR 0.85 06/16/2012   No results found for this basename: PTT    I/O last 3 completed shifts: In: 1266 [P.O.:180; I.V.:696; NG/GT:90; IV Piggyback:300] Out: 3230 [Urine:3230]    Physical Examination BP Readings from Last 3 Encounters:  06/22/12 107/58  06/22/12 107/58  06/22/12 107/58   Temp Readings from Last 3 Encounters:  06/22/12 97.8 F (36.6 C) Oral  06/22/12 97.8 F (36.6 C) Oral  06/22/12 97.8 F (36.6 C) Oral   SpO2 Readings from Last 3 Encounters:  06/22/12 97%  06/22/12 97%  06/22/12 97%   Pulse Readings from Last 3 Encounters:  06/22/12 82  06/22/12 82  06/22/12 82    General: A&O x 3, WDWN female in NAD Pulmonary: normal non-labored breathing , without Rales, rhonchi,  wheezing Cardiac: Heart rate : regular ,  Abdomen:abdomen  soft Abdominal wound:clean, dry, intact  Neurologic: A&O X 3; Appropriate Affect ;  SENSATION: normal;  MOTOR FUNCTION:  moving all extremities equally.  Speech is fluent/normal  Vascular Exam:BLE warm and well perfused.  Palpable DP pulses bilaterally  Extremities without ischemic changes, no Gangrene, no cellulitis; no open wounds;     Non-Invasive Vascular I ABI'S: Right 0.87  0.71 Left   Assessment/Plan: Lindsay [name] is a 67 y.o. female who is 5 Days Post-Op Procedure(s):  LEFT OOPHORECTOMY AORTOILIAC BYPASS AORTIC ENDARTERECETOMY Clear liquid diet today Glucose stable D/C sliding scale Transfer patient to 3300 maintain central line it is the only IV source      Mosetta Pigeon [phone] 06/22/2012 8:33 AM   Agree with above Continue to diurese still with some peripheral edema Requesting med for anxiety will start Ativan but emphasized only 2 week course otherwise she can discuss with her primary MD Regular diet tomorrow Hopefully d/c Wed/Thurs  Fabienne Bruns, MD [facility] [place] Office: [phone] Pager: [phone]

## 2012-06-22 NOTE — Progress Notes (Signed)
Physical Therapy Treatment Patient Details Name: Lindsay [name] MRN: [id number] DOB: [birth year] Today's Date: 06/22/2012 Time: 4098-1191 PT Time Calculation (min): 20 min  PT Assessment / Plan / Recommendation Comments on Treatment Session  Pt s/p aortobifemoral BPG with VDRF and post op ileus.  Pt beginning to progress with mobility ambulating on unit.  Continue PT to address endurance and balance.  Should be able to go home with husband and grandson and HHPT f/u.      Follow Up Recommendations  Home health PT;Supervision/Assistance - 24 hour                 Equipment Recommendations  Rolling walker with 5" wheels       Frequency Min 3X/week   Plan Frequency remains appropriate;Discharge plan needs to be updated    Precautions / Restrictions Precautions Precautions: Fall Restrictions Weight Bearing Restrictions: No   Pertinent Vitals/Pain VSS, Some pain    Mobility  Bed Mobility Bed Mobility: Sit to Supine;Scooting to [facility] Rolling Right: Not tested (comment) Right Sidelying to Sit: Not tested (comment) Sitting - Scoot to Edge of Bed: Not tested (comment) Sit to Supine: 4: Min assist Scooting to [facility]: 1: +2 Total assist Scooting to [facility]: Patient Percentage: 30% Details for Bed Mobility Assistance: Needed assist for upper body and LEs to lie down.   Transfers Transfers: Sit to Stand;Stand to Sit Sit to Stand: 4: Min assist;With upper extremity assist;With armrests;From chair/3-in-1 Stand to Sit: 4: Min assist;With upper extremity assist;To bed Stand Pivot Transfers: Not tested (comment) Details for Transfer Assistance: Pt needed cues for hand placement.  Still slightly unsteady at times needing steadying assist.  Holds pillow to abdomen with transitional movements.  Ambulation/Gait Ambulation/Gait Assistance: 4: Min assist Ambulation Distance (Feet): 320 Feet Assistive device: Other (Comment) (pushing wheelchair) Ambulation/Gait Assistance Details: Pt ambulating well  pushing wheelchair.  Pt needed occasional assist and cuing to steer wheelchair.  Upon return to room, assisted pt back to bed per nursing request as she had been up OOB awhile.   Gait Pattern: Step-through pattern;Trunk flexed;Wide base of support Gait velocity: decreased Stairs: No Wheelchair Mobility Wheelchair Mobility: No     PT Goals Acute Rehab PT Goals PT Goal: Sit at Edge Of Bed - Progress: Progressing toward goal PT Goal: Sit to Stand - Progress: Progressing toward goal PT Goal: Ambulate - Progress: Progressing toward goal  Visit Information  Last PT Received On: 06/22/12 Assistance Needed: +1    Subjective Data  Subjective: "I feel a lot better."   Cognition  Cognition Overall Cognitive Status: Appears within functional limits for tasks assessed/performed Arousal/Alertness: Awake/alert Orientation Level: Appears intact for tasks assessed Behavior During Session: [facility] for tasks performed    Balance  Static Sitting Balance Static Sitting - Balance Support: Bilateral upper extremity supported;Feet supported Static Sitting - Level of Assistance: 5: Stand by assistance Static Sitting - Comment/# of Minutes: Able to sit EOB without assist for up to 2 minutes. Static Standing Balance Static Standing - Balance Support: Bilateral upper extremity supported;During functional activity Static Standing - Level of Assistance: 4: Min assist Static Standing - Comment/# of Minutes: Needed min assist to steady self while NT cleaning her back and bottom for safety while pt holding onto wheelchair.    End of Session PT - End of Session Equipment Utilized During Treatment: Gait belt;Oxygen Activity Tolerance: Patient limited by fatigue;Patient limited by pain Patient left: in bed;with call bell/phone within reach;with nursing in room Nurse Communication: Mobility status  INGOLD,DAWN 06/22/2012, 10:12 AM  [facility] [phone]  (pager)

## 2012-06-23 ENCOUNTER — Encounter (HOSPITAL_COMMUNITY): Payer: Self-pay | Admitting: *Deleted

## 2012-06-23 MED ORDER — PANTOPRAZOLE SODIUM 40 MG PO TBEC
40.0000 mg | DELAYED_RELEASE_TABLET | Freq: Every day | ORAL | Status: DC
  Administered 2012-06-25 – 2012-07-01 (×7): 40 mg via ORAL
  Filled 2012-06-23 (×7): qty 1

## 2012-06-23 MED ORDER — LORAZEPAM 0.5 MG PO TABS: 0.5000 mg | ORAL_TABLET | Freq: Two times a day (BID) | ORAL | Status: DC

## 2012-06-23 MED ORDER — ASPIRIN EC 81 MG PO TBEC
81.0000 mg | DELAYED_RELEASE_TABLET | Freq: Every day | ORAL | Status: DC
  Administered 2012-06-23 – 2012-07-01 (×9): 81 mg via ORAL
  Filled 2012-06-23 (×9): qty 1

## 2012-06-23 MED ORDER — OXYCODONE-ACETAMINOPHEN 5-325 MG PO TABS
1.0000 | ORAL_TABLET | ORAL | Status: DC | PRN
  Administered 2012-06-23 – 2012-06-24 (×6): 1 via ORAL
  Administered 2012-06-25 – 2012-06-26 (×3): 2 via ORAL
  Administered 2012-06-27 (×2): 1 via ORAL
  Administered 2012-06-27: 2 via ORAL
  Administered 2012-06-28 (×2): 1 via ORAL
  Administered 2012-06-28: 2 via ORAL
  Administered 2012-06-28 – 2012-06-30 (×2): 1 via ORAL
  Filled 2012-06-23: qty 1
  Filled 2012-06-23: qty 2
  Filled 2012-06-23 (×3): qty 1
  Filled 2012-06-23: qty 2
  Filled 2012-06-23: qty 1
  Filled 2012-06-23: qty 2
  Filled 2012-06-23 (×2): qty 1
  Filled 2012-06-23: qty 2
  Filled 2012-06-23: qty 1
  Filled 2012-06-23: qty 2
  Filled 2012-06-23: qty 1
  Filled 2012-06-23: qty 2
  Filled 2012-06-23 (×4): qty 1

## 2012-06-23 NOTE — Progress Notes (Addendum)
[facility] [place]  Post-op  Intra-abdominal Surgery note  Date of Surgery: 06/17/2012  Surgeon(s): Sherren Kerns, MD Dara Lords, MD  6 Days Post-Op Procedure(s):  LEFT OOPHORECTOMY AORTObifem BYPASS AORTIC ENDARTERECETOMY  History of Present Illness  Lindsay [name] is a 67 y.o. female who is  6 days post-op. Pt had 1mg  ativan and was very sleepy with it. complains of incisional pain; denies nausea/vomiting; denies diarrhea. has had flatus;has not had BM  IMAGING: No results found.  Significant Diagnostic Studies: CBC Lab Results  Component Value Date   WBC 12.5* 06/20/2012   HGB 9.8* 06/20/2012   HCT 29.5* 06/20/2012   MCV 87.0 06/20/2012   PLT 123* 06/20/2012    BMET    Component Value Date/Time   NA 143 06/22/2012 0354   K 4.0 06/22/2012 0354   CL 107 06/22/2012 0354   CO2 29 06/22/2012 0354   GLUCOSE 77 06/22/2012 0354   BUN 26* 06/22/2012 0354   CREATININE 0.75 06/22/2012 0354   CALCIUM 8.2* 06/22/2012 0354   GFRNONAA 85* 06/22/2012 0354   GFRAA >90 06/22/2012 0354    COAG Lab Results  Component Value Date   INR 1.49 06/17/2012   INR 0.85 06/16/2012   No results found for this basename: PTT    I/O last 3 completed shifts: In: 2960 [P.O.:2240; I.V.:720] Out: 3490 [Urine:3490]    Physical Examination BP Readings from Last 3 Encounters:  06/23/12 110/60  06/23/12 110/60  06/23/12 110/60   Temp Readings from Last 3 Encounters:  06/23/12 97.8 F (36.6 C) Oral  06/23/12 97.8 F (36.6 C) Oral  06/23/12 97.8 F (36.6 C) Oral   SpO2 Readings from Last 3 Encounters:  06/23/12 99%  06/23/12 99%  06/23/12 99%   Pulse Readings from Last 3 Encounters:  06/23/12 84  06/23/12 84  06/23/12 84    General: A&O x 3, WDWN female in NAD Pulmonary: normal non-labored breathing , without Rales, rhonchi,  wheezing Cardiac: Heart rate : regular ,  Abdomen:abdomen soft and non-tender Abdominal wound:clean, dry, intact  Neurologic: A&O X 3;  Appropriate Affect ;  SENSATION: normal;  MOTOR FUNCTION:  moving all extremities equally.  Speech is fluent/normal  Vascular Exam:BLE warm and well perfused Extremities without ischemic changes, no Gangrene, no cellulitis; no open wounds;   LOWER EXTREMITY PULSES           RIGHT                                      LEFT      DORSALIS PEDIS      ANTERIOR TIBIAL  palpable  palpable    Non-Invasive Vascular Imaging ABI'S: Right 0.87; Left 0.71  Assessment/Plan: Lindsay [name] is a 67 y.o. female who is 6 Days Post-Op Procedure(s):  LEFT OOPHORECTOMY AORTObifem BYPASS AORTIC ENDARTERECETOMY Will DC PCA and begin PO meds Decrease ativan  Transfer orders in place   Marlowe Shores [phone] 06/23/2012 7:51 AM   Pathology benign on ovary, informed pt Feet warm palpable dp pulses Incisions healing More confused today will stop ativan Advance diet  Transfer to 2000  Fabienne Bruns, MD [facility] [place] Office: [phone] Pager: [phone]

## 2012-06-24 ENCOUNTER — Inpatient Hospital Stay (HOSPITAL_COMMUNITY): Payer: Medicare PPO

## 2012-06-24 MED ORDER — KCL IN DEXTROSE-NACL 20-5-0.45 MEQ/L-%-% IV SOLN
INTRAVENOUS | Status: DC
  Administered 2012-06-24 – 2012-06-27 (×4): via INTRAVENOUS
  Administered 2012-06-28: 1000 mL via INTRAVENOUS
  Filled 2012-06-24 (×12): qty 1000

## 2012-06-24 MED ORDER — POTASSIUM CHLORIDE IN NACL 20-0.9 MEQ/L-% IV SOLN
INTRAVENOUS | Status: DC
  Filled 2012-06-24: qty 1000

## 2012-06-24 NOTE — Progress Notes (Signed)
IV metoprolol held today; SBP 100; will cont. To monitor.

## 2012-06-24 NOTE — Progress Notes (Signed)
Physical Therapy Treatment Patient Details Name: Lindsay [name] MRN: [id number] DOB: 01-15-1946 Today's Date: 06/24/2012 Time: 1025-1050 PT Time Calculation (min): 25 min  PT Assessment / Plan / Recommendation Comments on Treatment Session  Some pain continues s/p aortobifemoral BPG.  Sats on 2L maintained at 90to 93%.  Pt improving steadily.    Follow Up Recommendations  Home health PT;Supervision/Assistance - 24 hour     Does the patient have the potential to tolerate intense rehabilitation     Barriers to Discharge        Equipment Recommendations  Rolling walker with 5" wheels    Recommendations for Other Services    Frequency Min 3X/week   Plan Frequency remains appropriate;Discharge plan needs to be updated    Precautions / Restrictions Precautions Precautions: Fall Restrictions Weight Bearing Restrictions: No   Pertinent Vitals/Pain See comments above    Mobility  Bed Mobility Bed Mobility: Not assessed Transfers Transfers: Sit to Stand;Stand to Sit Sit to Stand: 4: Min assist;With upper extremity assist;From chair/3-in-1 Stand to Sit: 4: Min assist;With upper extremity assist;To chair/3-in-1 Details for Transfer Assistance: vc's for hand placement, extra time and minimal steadying assist due to pain Ambulation/Gait Ambulation/Gait Assistance: 4: Min assist Ambulation Distance (Feet): 900 Feet (from 2300 to 2009) Assistive device:  (W/C to push) Ambulation/Gait Assistance Details: Slow of speed, but able to incr speed if cued.  Generally steady Gait Pattern: Step-through pattern;Decreased step length - right;Decreased step length - left;Decreased stride length Gait velocity: decreased    Exercises     PT Diagnosis:    PT Problem List:   PT Treatment Interventions:     PT Goals Acute Rehab PT Goals Time For Goal Achievement: 07/02/12 Potential to Achieve Goals: Good Pt will Sit at [facility] of Bed: Independently;3-5 min PT Goal: Sit at Edge Of Bed -  Progress: Progressing toward goal Pt will go Sit to Stand: with supervision;with upper extremity assist PT Goal: Sit to Stand - Progress: Progressing toward goal Pt will Transfer Bed to Chair/Chair to Bed: with supervision PT Transfer Goal: Bed to Chair/Chair to Bed - Progress: Progressing toward goal Pt will Ambulate: 51 - 150 feet;with supervision;with least restrictive assistive device PT Goal: Ambulate - Progress: Met  Visit Information  Last PT Received On: 06/24/12 Assistance Needed: +1    Subjective Data  Subjective: I'm pushing myself as much as I can.   Cognition  Cognition Overall Cognitive Status: Appears within functional limits for tasks assessed/performed Arousal/Alertness: Awake/alert Orientation Level: Appears intact for tasks assessed Behavior During Session: [facility] for tasks performed    Balance     End of Session PT - End of Session Equipment Utilized During Treatment: Oxygen Activity Tolerance: Patient tolerated treatment well Patient left: in chair;with call bell/phone within reach Nurse Communication: Mobility status   GP     Mottinger, Eliseo Gum 06/24/2012, 11:03 AM 06/24/2012  [place], PT [phone] (pager)

## 2012-06-24 NOTE — Progress Notes (Signed)
Patient experience nausea through out the night, patient does have hypoactive bowel sounds and is passing gas, however still has nausea. Zofran given as ordered, patient encouraged, and per patient request, changed her diet to a full liquid diet.  Will continue to watch

## 2012-06-24 NOTE — Progress Notes (Signed)
VASCULAR PROGRESS NOTE  SUBJECTIVE: Complains of nausea this morning. This began last night.  PHYSICAL EXAM: Filed Vitals:   06/24/12 0300 06/24/12 0330 06/24/12 0400 06/24/12 0500  BP:   95/55 116/49  Pulse: 75  71 70  Temp:  98.1 F (36.7 C)    TempSrc:  Oral    Resp: 11  11 9   Height:      Weight:  146 lb (66.225 kg)    SpO2: 97%  98% 98%   Abdomen: Slightly distended. Mildly tender. Hypoactive bowel sounds. Incisions look fine. Palpable dorsalis pedis pulses.  LABS: Lab Results  Component Value Date   WBC 12.5* 06/20/2012   HGB 9.8* 06/20/2012   HCT 29.5* 06/20/2012   MCV 87.0 06/20/2012   PLT 123* 06/20/2012   Lab Results  Component Value Date   CREATININE 0.75 06/22/2012   Lab Results  Component Value Date   INR 1.49 06/17/2012   CBG (last 3)   Recent Labs  06/21/12 2319 06/22/12 0419 06/22/12 0733  GLUCAP 74 74 70    Active Problems:   Acute respiratory failure   COPD (chronic obstructive pulmonary disease)   Hypotension   ASSESSMENT AND PLAN: 1. 7 Days Post-Op s/p: aortobifemoral bypass graft. 2. Complains of nausea this morning. Will back down on diet. Will obtain abdominal x-ray to rule out an ileus. We'll also check amylase to rule out pancreatitis. Will increase her IV fluids until this issue has resolved. 3. Cardiac: Stable 4. Pulmonary: Stable 5. Renal function normal 6. Awaiting bed on 2000.  Cari Caraway Beeper: [phone] 06/24/2012

## 2012-06-25 ENCOUNTER — Inpatient Hospital Stay (HOSPITAL_COMMUNITY): Payer: Medicare PPO

## 2012-06-25 LAB — CBC
MCH: 29.2 pg (ref 26.0–34.0)
MCHC: 33.5 g/dL (ref 30.0–36.0)
MCV: 87.4 fL (ref 78.0–100.0)
Platelets: 248 10*3/uL (ref 150–400)
RBC: 3.18 MIL/uL — ABNORMAL LOW (ref 3.87–5.11)

## 2012-06-25 LAB — BASIC METABOLIC PANEL
CO2: 31 mEq/L (ref 19–32)
Calcium: 8.2 mg/dL — ABNORMAL LOW (ref 8.4–10.5)
Creatinine, Ser: 0.75 mg/dL (ref 0.50–1.10)
GFR calc non Af Amer: 85 mL/min — ABNORMAL LOW (ref >90)
Glucose, Bld: 99 mg/dL (ref 70–99)
Sodium: 140 mEq/L (ref 135–145)

## 2012-06-25 LAB — PRO B NATRIURETIC PEPTIDE: Pro B Natriuretic peptide (BNP): 745.2 pg/mL — ABNORMAL HIGH (ref 0–125)

## 2012-06-25 MED ORDER — POTASSIUM CHLORIDE CRYS ER 20 MEQ PO TBCR
20.0000 meq | EXTENDED_RELEASE_TABLET | Freq: Once | ORAL | Status: AC
  Administered 2012-06-25: 20 meq via ORAL
  Filled 2012-06-25: qty 1

## 2012-06-25 MED ORDER — FUROSEMIDE 10 MG/ML IJ SOLN
20.0000 mg | Freq: Once | INTRAMUSCULAR | Status: AC
  Administered 2012-06-25: 20 mg via INTRAVENOUS
  Filled 2012-06-25: qty 2

## 2012-06-25 MED ORDER — TRAMADOL HCL 50 MG PO TABS
50.0000 mg | ORAL_TABLET | Freq: Four times a day (QID) | ORAL | Status: DC | PRN
  Administered 2012-06-25 – 2012-06-30 (×8): 50 mg via ORAL
  Filled 2012-06-25 (×9): qty 1

## 2012-06-25 MED ORDER — BISACODYL 10 MG RE SUPP
10.0000 mg | Freq: Once | RECTAL | Status: AC
  Administered 2012-06-25: 10 mg via RECTAL

## 2012-06-25 MED ORDER — PNEUMOCOCCAL VAC POLYVALENT 25 MCG/0.5ML IJ INJ
0.5000 mL | INJECTION | INTRAMUSCULAR | Status: AC
  Administered 2012-06-26: 0.5 mL via INTRAMUSCULAR
  Filled 2012-06-25: qty 0.5

## 2012-06-25 NOTE — Progress Notes (Signed)
Physical Therapy Treatment Patient Details Name: Lindsay [name] MRN: [id number] DOB: [birth year] Today's Date: 06/25/2012 Time: 4098-1191 PT Time Calculation (min): 24 min  PT Assessment / Plan / Recommendation Comments on Treatment Session  Pt s/p VDRF and aortobifemoral BPG.  Will benefit from continued PT.  Needed 3L to keep O2 >90%.      Follow Up Recommendations  Home health PT;Supervision/Assistance - 24 hour                 Equipment Recommendations  Rolling walker with 5" wheels        Frequency Min 3X/week   Plan Discharge plan remains appropriate;Frequency remains appropriate    Precautions / Restrictions Precautions Precautions: Fall Restrictions Weight Bearing Restrictions: No   Pertinent Vitals/Pain Desat to 83% on 2L; incr to 3LO2 to keep sats >90%.  Reviewed incentive spirometer on return to rooom.  No pain.     Mobility  Bed Mobility Bed Mobility: Not assessed Transfers Transfers: Sit to Stand;Stand to Sit Sit to Stand: 4: Min guard;From chair/3-in-1;Without upper extremity assist Stand to Sit: 4: Min guard;Without upper extremity assist;To chair/3-in-1 Stand Pivot Transfers: Not tested (comment) Details for Transfer Assistance: vc's for hand placement, extra time and minimal steadying assist due to pain Ambulation/Gait Ambulation/Gait Assistance: 4: Min guard Ambulation Distance (Feet): 450 Feet Assistive device: Rolling walker Ambulation/Gait Assistance Details: Increasing speed.  Improved gait pattern.  Occasionally needs steadying assist and cues to stay close to RW.  Gait Pattern: Step-through pattern;Decreased step length - right;Decreased step length - left;Decreased stride length Gait velocity: decreased Stairs: No Wheelchair Mobility Wheelchair Mobility: No       PT Goals Acute Rehab PT Goals PT Goal: Sit to Stand - Progress: Progressing toward goal PT Goal: Ambulate - Progress: Progressing toward goal  Visit Information  Last PT  Received On: 06/25/12 Assistance Needed: +1    Subjective Data  Subjective: "I want to walk."   Cognition  Cognition Overall Cognitive Status: Appears within functional limits for tasks assessed/performed Arousal/Alertness: Awake/alert Orientation Level: Appears intact for tasks assessed Behavior During Session: [facility] for tasks performed    Balance  Static Sitting Balance Static Sitting - Balance Support: Bilateral upper extremity supported;Feet supported Static Sitting - Level of Assistance: 5: Stand by assistance Static Sitting - Comment/# of Minutes: Able to sit EOB without assist for up to 3 minutes. Static Standing Balance Static Standing - Balance Support: Bilateral upper extremity supported;During functional activity Static Standing - Level of Assistance: 5: Stand by assistance Static Standing - Comment/# of Minutes: Can stand statically x 2 minutes with stand by assist with steadiness with RW.    End of Session PT - End of Session Equipment Utilized During Treatment: Gait belt;Oxygen Activity Tolerance: Patient limited by fatigue Patient left: in chair;with call bell/phone within reach Nurse Communication: Mobility status        INGOLD,DAWN 06/25/2012, 12:59 PM [facility] Acute Rehabilitation [phone] (pager)

## 2012-06-25 NOTE — Progress Notes (Addendum)
[facility] Progress Note  06/25/2012 7:35 AM POD 8  Subjective:  States she is feeling SOB and chest pressure; nausea improved this am-afraid to eat  Afebrile x 24 hrs VSS  98% 3LO2NC  Filed Vitals:   06/25/12 0441  BP: 101/56  Pulse: 86  Temp: 98.1 F (36.7 C)  Resp: 18     Physical Exam: Cardiac:  regular Lungs:  CTAB Abdomen:  +BS +flatus -BM Incisions:  C/d/i Extremities:  + palpable pulses DP bilaterally; trace edema  CBC    Component Value Date/Time   WBC 8.3 06/25/2012 0550   RBC 3.18* 06/25/2012 0550   HGB 9.3* 06/25/2012 0550   HCT 27.8* 06/25/2012 0550   PLT 248 06/25/2012 0550   MCV 87.4 06/25/2012 0550   MCH 29.2 06/25/2012 0550   MCHC 33.5 06/25/2012 0550   RDW 14.4 06/25/2012 0550    BMET    Component Value Date/Time   NA 140 06/25/2012 0550   K 3.2* 06/25/2012 0550   CL 103 06/25/2012 0550   CO2 31 06/25/2012 0550   GLUCOSE 99 06/25/2012 0550   BUN 6 06/25/2012 0550   CREATININE 0.75 06/25/2012 0550   CALCIUM 8.2* 06/25/2012 0550   GFRNONAA 85* 06/25/2012 0550   GFRAA >90 06/25/2012 0550    INR    Component Value Date/Time   INR 1.49 06/17/2012 1444   Amylase: 36 Port Abd xray: mild ileus  Intake/Output Summary (Last 24 hours) at 06/25/12 0735 Last data filed at 06/24/12 1838  Gross per 24 hour  Intake 1667.5 ml  Output   1451 ml  Net  216.5 ml     Assessment/Plan:  67 y.o. female is s/p AoBifem bypass graft  POD 8  -will get EKG/troponins/CXR for her SOB and Chest pressure.  Lungs sound clear throughout -continue inhalers -dulcolax supp-mild ileus on abd x-ray. -mobilization -IS 10x/hr while awake   Doreatha Massed, PA-C [facility] [phone] 06/25/2012 7:35 AM  No further CP. No nausea currently. Good bowel sounds. Will advance diet.  Try ultram for pain. Nausea before may have been secondary to oxycodone.  Continue to mobilize.  Waverly Ferrari, MD, FACS Beeper  [phone] 06/25/2012

## 2012-06-25 NOTE — Progress Notes (Signed)
Occupational Therapy Treatment Patient Details Name: Lindsay [name] MRN: [id number] DOB: 04-Apr-1946 Today's Date: 06/25/2012 Time: 0865-7846 OT Time Calculation (min): 31 min  OT Assessment / Plan / Recommendation Comments on Treatment Session Pt progressing nicely with ADLs.  Pt is motivated and is eager to improve enough to return home.  Dyspnea 2/4 with O2    Follow Up Recommendations  Home health OT;Supervision/Assistance - 24 hour    Barriers to Discharge       Equipment Recommendations  3 in 1 bedside comode    Recommendations for Other Services    Frequency Min 2X/week   Plan Discharge plan needs to be updated    Precautions / Restrictions Precautions Precautions: Fall Restrictions Weight Bearing Restrictions: No   Pertinent Vitals/Pain     ADL  Grooming: Simulated;Wash/dry hands;Teeth care;Supervision/safety Where Assessed - Grooming: Supported standing Lower Body Bathing: Simulated;Min guard Where Assessed - Lower Body Bathing: Supported sit to stand Lower Body Dressing: Simulated;Performed;Min guard Where Assessed - Lower Body Dressing: Supported sit to stand Transfers/Ambulation Related to ADLs: ambulates with RW min guard assist and 3L 02 ADL Comments: Pt. able to access feet for LB ADLs by crossing ankles over knees and no increase in pain.  Pt reports she has a shower seat in her shower    OT Diagnosis:    OT Problem List:   OT Treatment Interventions:     OT Goals ADL Goals ADL Goal: Grooming - Progress: Progressing toward goals ADL Goal: Toilet Transfer - Progress: Progressing toward goals Miscellaneous OT Goals OT Goal: Miscellaneous Goal #1 - Progress: Progressing toward goals OT Goal: Miscellaneous Goal #2 - Progress: Met  Visit Information  Last OT Received On: 06/25/12 Assistance Needed: +1    Subjective Data      Prior Functioning       Cognition  Cognition Overall Cognitive Status: Appears within functional limits for tasks  assessed/performed Arousal/Alertness: Awake/alert Orientation Level: Appears intact for tasks assessed Behavior During Session: [facility] for tasks performed    Mobility  Bed Mobility Bed Mobility: Supine to Sit;Sitting - Scoot to Edge of Bed Supine to Sit: 5: Supervision;With rails;HOB elevated Sitting - Scoot to Edge of Bed: 6: Modified independent (Device/Increase time) Transfers Transfers: Sit to Stand;Stand to Sit Sit to Stand: 4: Min guard;With upper extremity assist;From bed;From chair/3-in-1 Stand to Sit: 4: Min guard;With upper extremity assist;To chair/3-in-1 Details for Transfer Assistance: increased time to complete    Exercises         End of Session OT - End of Session Activity Tolerance: Patient tolerated treatment well Patient left: in chair;with call bell/phone within reach  GO     Conarpe, Wendi M 06/25/2012, 2:23 PM

## 2012-06-25 NOTE — Progress Notes (Signed)
SATURATION QUALIFICATIONS: (This note is used to comply with regulatory documentation for home oxygen)  Patient Saturations on Room Air at Rest = 85%  Patient Saturations on Room Air while Ambulating = 78%  Patient Saturations on 3 Liters of oxygen while Ambulating = 94%  Please briefly explain why patient needs home oxygen:Desats without O2.  O2 > 90% with 3LO2.  Thanks. [facility] Acute Rehabilitation [phone] (pager)

## 2012-06-26 LAB — BASIC METABOLIC PANEL
CO2: 34 mEq/L — ABNORMAL HIGH (ref 19–32)
Chloride: 101 mEq/L (ref 96–112)
Creatinine, Ser: 0.87 mg/dL (ref 0.50–1.10)
GFR calc Af Amer: 78 mL/min — ABNORMAL LOW (ref >90)
Potassium: 3.5 mEq/L (ref 3.5–5.1)
Sodium: 142 mEq/L (ref 135–145)

## 2012-06-26 MED ORDER — POTASSIUM CHLORIDE CRYS ER 20 MEQ PO TBCR
20.0000 meq | EXTENDED_RELEASE_TABLET | Freq: Once | ORAL | Status: AC
  Administered 2012-06-26: 20 meq via ORAL
  Filled 2012-06-26: qty 1

## 2012-06-26 MED ORDER — FUROSEMIDE 10 MG/ML IJ SOLN
20.0000 mg | Freq: Once | INTRAMUSCULAR | Status: AC
  Administered 2012-06-26: 20 mg via INTRAVENOUS
  Filled 2012-06-26: qty 2

## 2012-06-26 MED ORDER — METOCLOPRAMIDE HCL 5 MG/ML IJ SOLN
10.0000 mg | Freq: Four times a day (QID) | INTRAMUSCULAR | Status: AC
  Administered 2012-06-26 (×3): 10 mg via INTRAVENOUS
  Filled 2012-06-26 (×3): qty 2

## 2012-06-26 MED ORDER — IPRATROPIUM-ALBUTEROL 20-100 MCG/ACT IN AERS
2.0000 | INHALATION_SPRAY | Freq: Four times a day (QID) | RESPIRATORY_TRACT | Status: DC | PRN
  Filled 2012-06-26: qty 4

## 2012-06-26 NOTE — Care Management Note (Signed)
    Page 1 of 2   07/01/2012     11:49:38 AM   CARE MANAGEMENT NOTE 07/01/2012  Patient:  Lindsay [name], Lindsay [name]   Account Number:  [id number]  Date Initiated:  06/18/2012  Documentation initiated by:  [facility]  Subjective/Objective Assessment:   post op aorta bifem.     Action/Plan:   Anticipated DC Date:  06/29/2012   Anticipated DC Plan:  HOME W HOME HEALTH SERVICES      DC Planning Services  CM consult      [facility] Choice  HOME HEALTH   Choice offered to / List presented to:  C-1 Patient   DME arranged  WALKER - ROLLING  OXYGEN      DME agency  [facility]     HH arranged  HH-2 PT  HH-3 OT  HH-1 RN      [facility] agency  [facility]   Status of service:  Completed, signed off Medicare Important Message given?   (If response is "NO", the following Medicare IM given date fields will be blank) Date Medicare IM given:   Date Additional Medicare IM given:    Discharge Disposition:  HOME W HOME HEALTH SERVICES  Per UR Regulation:  Reviewed for med. necessity/level of care/duration of stay  If discussed at Long Length of Stay Meetings, dates discussed:    Comments:  ContactAmoreena, [place] [phone]   [phone]  from [place].  07-01-12 10:30am Avie Arenas, RNBSN [phone] Will need oxygen for discharge home today.  Called, faxed and verified orders to [facility] for delivery today. Added HH RN to discharge today.  Called [facility] to verify they will add a nurse and faxed out info.  They will do RN starting tomorrow.   Updated family.  06-29-12 2:45pm Avie Arenas, RNBSN - [phone] [facility] called and stated cannot take this case as does not take this insurance.  PT/OT changed to [facility]. Talked with Kendal Hymen at [phone] and faxed all info to [phone].  06/26/12 JULIE AMERSON,RN,BSN [phone] MET WITH PT AND SPOUSE TO DISCUSS DC PLANS.  HUSBAND TO PROVIDE CARE AT DC.  WILL NEED HH FOLLOW UP-NO PREFERENCE FOR HH AGENCY.  REFERRAL TO  [facility] OF [place], AND FAXED REFERRAL TO NICKI AT [phone]. PT REQUEST RW--REFERRAL TO [facility], PER INSURANCE CONTRACT.  THEY WILL DELIVER TO PT AT HOSPITAL.  MAY NEED HOME O2 IF UNABLE TO WEAN.  PLEASE NOTIFY CASE MANAGER IF HOME O2 NEEDED.  06-19-12 3:40pm Avie Arenas, RNBSN [phone] Talked with patient, in bed, having a lot of pain. not moving around much.  Having difficulty even talking with me.  confirmed that she has a spouse, vague on going home or needing rehab on discharge.  Will continue to follow for progression.  PT consulted.

## 2012-06-26 NOTE — Progress Notes (Signed)
Pt ambulated 400 ft with standby assist and walker on 3L 02. Pt tolerated ambulation well. Back to chair with call bell in reach. Lindsay [name]

## 2012-06-26 NOTE — Progress Notes (Addendum)
   BNP    Component Value Date/Time   PROBNP 745.2* 06/25/2012 1300   Pt BNP elevated.  Lasix 20 mg IV x 1 dose was given yesterday.  Will give another 20 mg of Lasix x 1 dose today as well as K+ supplementation.  Doreatha Massed 06/26/2012 10:36 AM  Agree with above. Follow up electrolytes in the a.m.  Waverly Ferrari, MD, FACS Beeper [phone] 06/26/2012

## 2012-06-26 NOTE — Progress Notes (Signed)
Physical Therapy Treatment Patient Details Name: Lindsay [name] MRN: [id number] DOB: 1945-07-26 Today's Date: 06/26/2012 Time: 1201-1230 PT Time Calculation (min): 29 min  PT Assessment / Plan / Recommendation Comments on Treatment Session  Pt s/p VDRF and aortobifemoral BPG.  Will benefit from continued PT.  Needed 2L to keep O2 >90% which is less O2 than yesterday.    Follow Up Recommendations  Home health PT;Supervision/Assistance - 24 hour                 Equipment Recommendations  Rolling walker with 5" wheels        Frequency Min 3X/week   Plan Discharge plan remains appropriate;Frequency remains appropriate    Precautions / Restrictions Precautions Precautions: Fall Restrictions Weight Bearing Restrictions: No   Pertinent Vitals/Pain VSS, Some pain    Mobility  Bed Mobility Bed Mobility: Supine to Sit;Sitting - Scoot to Edge of Bed Rolling Right: Not tested (comment) Right Sidelying to Sit: Not tested (comment) Supine to Sit: 6: Modified independent (Device/Increase time);HOB flat;With rails Sitting - Scoot to Edge of Bed: 6: Modified independent (Device/Increase time) Sit to Supine: Not Tested (comment) Scooting to [facility]: Not tested (comment) Details for Bed Mobility Assistance: No assist needed today for supine to sit. Transfers Transfers: Sit to Stand;Stand to Sit Sit to Stand: 5: Supervision;With upper extremity assist;From bed Stand to Sit: 5: Supervision;With upper extremity assist;With armrests;To chair/3-in-1 Stand Pivot Transfers: Not tested (comment) Details for Transfer Assistance: increased time to complete Ambulation/Gait Ambulation/Gait Assistance: 4: Min guard Ambulation Distance (Feet): 500 Feet Assistive device: Rolling walker Ambulation/Gait Assistance Details: Continues with improved gait pattern.  Occasionally needs steadying assist and cues to stay close to RW.   Gait Pattern: Step-through pattern;Decreased step length - right;Decreased  step length - left;Decreased stride length Gait velocity: decreased Stairs: No Wheelchair Mobility Wheelchair Mobility: No       PT Goals Acute Rehab PT Goals PT Goal: Supine/Side to Sit - Progress: Progressing toward goal PT Goal: Sit at [place] Of Bed - Progress: Met PT Goal: Sit to Stand - Progress: Met PT Goal: Ambulate - Progress: Progressing toward goal  Visit Information  Last PT Received On: 06/26/12 Assistance Needed: +1    Subjective Data  Subjective: "I want to get up and go down the hall."   Cognition  Cognition Overall Cognitive Status: Appears within functional limits for tasks assessed/performed Arousal/Alertness: Awake/alert Orientation Level: Appears intact for tasks assessed Behavior During Session: [facility] for tasks performed    Balance  Static Sitting Balance Static Sitting - Balance Support: Bilateral upper extremity supported;Feet supported Static Sitting - Level of Assistance: 5: Stand by assistance Static Standing Balance Static Standing - Balance Support: Bilateral upper extremity supported;During functional activity Static Standing - Level of Assistance: 5: Stand by assistance  End of Session PT - End of Session Equipment Utilized During Treatment: Gait belt;Oxygen Activity Tolerance: Patient limited by fatigue Patient left: in chair;with call bell/phone within reach Nurse Communication: Mobility status        INGOLD,DAWN 06/26/2012, 1:49 PM [facility] Acute Rehabilitation [phone] (pager)

## 2012-06-26 NOTE — Progress Notes (Addendum)
[facility] OF [place]  Post-op  Intra-abdominal Surgery note  Date of Surgery: 06/17/2012  Surgeon(s): Sherren Kerns, MD Dara Lords, MD  9 Days Post-Op Procedure(s):  LEFT OOPHORECTOMY AORTOILIAC BYPASS AORTIC ENDARTERECETOMY  History of Present Illness  Lindsay [name] is a 67 y.o. female who is  up s/p Procedure(s):  LEFT OOPHORECTOMY AORTOILIAC BYPASS AORTIC ENDARTERECETOMY Pt is doing well. complains of incisional pain; complains of nausea/vomiting; denies diarrhea. has had flatus;has not had BM  IMAGING: Dg Chest Port 1 View  06/25/2012  *RADIOLOGY REPORT*  Clinical Data: Shortness of breath.  PORTABLE CHEST - 1 VIEW  Comparison: 06/19/2012  Findings: Bibasilar airspace opacities and effusions again noted. Heart is normal size.  Right central line is unchanged.  No acute bony abnormality.  IMPRESSION: No significant change bibasilar opacities and effusions.   Original Report Authenticated By: Charlett Nose, M.D.     Significant Diagnostic Studies: CBC Lab Results  Component Value Date   WBC 8.3 06/25/2012   HGB 9.3* 06/25/2012   HCT 27.8* 06/25/2012   MCV 87.4 06/25/2012   PLT 248 06/25/2012    BMET    Component Value Date/Time   NA 142 06/26/2012 0431   K 3.5 06/26/2012 0431   CL 101 06/26/2012 0431   CO2 34* 06/26/2012 0431   GLUCOSE 113* 06/26/2012 0431   BUN 6 06/26/2012 0431   CREATININE 0.87 06/26/2012 0431   CALCIUM 8.4 06/26/2012 0431   GFRNONAA 67* 06/26/2012 0431   GFRAA 78* 06/26/2012 0431    COAG Lab Results  Component Value Date   INR 1.49 06/17/2012   INR 0.85 06/16/2012   No results found for this basename: PTT    I/O last 3 completed shifts: In: 720 [P.O.:720] Out: 1600 [Urine:1600] Total I/O In: -  Out: 250 [Urine:250]  Physical Examination BP Readings from Last 3 Encounters:  06/26/12 100/40  06/26/12 100/40  06/26/12 100/40   Temp Readings from Last 3 Encounters:  06/26/12 98 F (36.7 C)   06/26/12 98 F (36.7  C)   06/26/12 98 F (36.7 C)    SpO2 Readings from Last 3 Encounters:  06/25/12 97%  06/25/12 97%  06/25/12 97%   Pulse Readings from Last 3 Encounters:  06/26/12 75  06/26/12 75  06/26/12 75    General: A&O x 3, WDWN female in NAD Pulmonary: normal non-labored breathing , without Rales, rhonchi,  wheezing Cardiac: Heart rate : regular ,  Abdomen:abdomen soft Abdominal wound:clean, dry, intact  Neurologic: A&O X 3; Appropriate Affect ;  SENSATION: normal;  MOTOR FUNCTION:  moving all extremities equally.  Speech is fluent/normal  Vascular Exam:BLE warm and well perfused.  Palpable pulses Extremities without ischemic changes, no Gangrene, no cellulitis; no open wounds;   Non-Invasive Vascular Imaging ABI  RIGHT 0.87 LEFT 0.71  Assessment/Plan: Lindsay [name] is a 67 y.o. female who is 9 Days Post-Op Procedure(s):  LEFT OOPHORECTOMY AORTOILIAC BYPASS AORTIC ENDARTERECTOMY Nausea still present post prandial this am. I will give her Reglan 10mg  q6 for 3 doses.  Continue Zofran and dulcolax PRN  Mosetta Pigeon [phone] 06/26/2012 9:59 AM  Agree with above. At this point, the only real issue is her nausea. She is passing flatus but no BM. She tells me that she has chronic constipation. Her abdomen is slightly distended but she has normal pitch bowel sounds. I think that her ileus is resolving but we will have to continue to go slowly with her diet. I've  encouraged her to walk as much as possible and minimize her pain medicine. Incisions look fine she has palpable pedal pulses.  She will be ready for discharge once she is tolerating her diet.  Waverly Ferrari, MD, FACS Beeper [phone] 06/26/2012

## 2012-06-27 LAB — BASIC METABOLIC PANEL
CO2: 36 mEq/L — ABNORMAL HIGH (ref 19–32)
Calcium: 8.1 mg/dL — ABNORMAL LOW (ref 8.4–10.5)
Chloride: 100 mEq/L (ref 96–112)
Glucose, Bld: 104 mg/dL — ABNORMAL HIGH (ref 70–99)
Potassium: 3.6 mEq/L (ref 3.5–5.1)
Sodium: 140 mEq/L (ref 135–145)

## 2012-06-27 MED ORDER — SORBITOL 70 % SOLN
960.0000 mL | TOPICAL_OIL | Freq: Once | ORAL | Status: DC
  Filled 2012-06-27: qty 240

## 2012-06-27 MED ORDER — SODIUM CHLORIDE 0.9 % IJ SOLN: 10.0000 mL | INTRAMUSCULAR | Status: DC | PRN

## 2012-06-27 NOTE — Progress Notes (Signed)
Patient ambulated 100 ft with rolling walker, 02 at 2 l, and standby assist. Patient tolerated walking well.   Will continue to monitor patient

## 2012-06-27 NOTE — Progress Notes (Addendum)
[facility] [place]  Post-op  Intra-abdominal Surgery note  Date of Surgery: 06/17/2012  Surgeon(s): Sherren Kerns, MD Dara Lords, MD  10 Days Post-Op Procedure(s):  LEFT OOPHORECTOMY AORTOILIAC BYPASS AORTIC ENDARTERECETOMY  History of Present Illness  Lindsay [name] is a 67 y.o. female who is  up s/p Procedure(s):  LEFT OOPHORECTOMY AORTOILIAC BYPASS AORTIC ENDARTERECETOMY Pt is doing well. complains of incisional pain; denies nausea/vomiting; denies diarrhea. has had flatus;has not had BM  IMAGING: Dg Chest Port 1 View  06/25/2012  *RADIOLOGY REPORT*  Clinical Data: Shortness of breath.  PORTABLE CHEST - 1 VIEW  Comparison: 06/19/2012  Findings: Bibasilar airspace opacities and effusions again noted. Heart is normal size.  Right central line is unchanged.  No acute bony abnormality.  IMPRESSION: No significant change bibasilar opacities and effusions.   Original Report Authenticated By: Charlett Nose, M.D.     Significant Diagnostic Studies: CBC Lab Results  Component Value Date   WBC 8.3 06/25/2012   HGB 9.3* 06/25/2012   HCT 27.8* 06/25/2012   MCV 87.4 06/25/2012   PLT 248 06/25/2012    BMET    Component Value Date/Time   NA 140 06/27/2012 0550   K 3.6 06/27/2012 0550   CL 100 06/27/2012 0550   CO2 36* 06/27/2012 0550   GLUCOSE 104* 06/27/2012 0550   BUN 5* 06/27/2012 0550   CREATININE 0.85 06/27/2012 0550   CALCIUM 8.1* 06/27/2012 0550   GFRNONAA 69* 06/27/2012 0550   GFRAA 80* 06/27/2012 0550    COAG Lab Results  Component Value Date   INR 1.49 06/17/2012   INR 0.85 06/16/2012   No results found for this basename: PTT    I/O last 3 completed shifts: In: 3807.5 [P.O.:480; I.V.:3327.5] Out: 1750 [Urine:1750]    Physical Examination BP Readings from Last 3 Encounters:  06/27/12 92/48  06/27/12 92/48  06/27/12 92/48   Temp Readings from Last 3 Encounters:  06/27/12 98.5 F (36.9 C) Oral  06/27/12 98.5 F (36.9 C) Oral  06/27/12  98.5 F (36.9 C) Oral   SpO2 Readings from Last 3 Encounters:  06/27/12 97%  06/27/12 97%  06/27/12 97%   Pulse Readings from Last 3 Encounters:  06/27/12 79  06/27/12 79  06/27/12 79    General: A&O x 3, WDWN female in NAD Pulmonary: normal non-labored breathing , without Rales, rhonchi,  wheezing Cardiac: Heart rate : regular ,  Abdomen:abdomen soft, incisional pain Abdominal wound:clean, dry, intact  Neurologic: A&O X 3; Appropriate Affect ;  SENSATION: normal;  MOTOR FUNCTION:  moving all extremities equally.  Speech is fluent/normal Vascular Exam:BLE warm and well perfused Palpable pulses Extremities without ischemic changes, no Gangrene, no cellulitis; no open wounds;  Bilateral foot edema   Non-Invasive Vascular Imaging ABI'S: Right 0.87 0.71 Left   Assessment/Plan: Lindsay [name] is a 67 y.o. female who is 10 Days Post-Op Procedure(s):  LEFT OOPHORECTOMY AORTOILIAC BYPASS AORTIC ENDARTERECTOMY No nausea post praedial this am. Urine output daily was 1,750 after 1 dose of lasix.  BP stable 92/48 asymptomatic.  Clinton Gallant [facility] [phone] 06/27/2012 8:15 AM  Agree with above. Is tolerating her regular diet but has still not had a bowel movement. She is passing flatus and her abdomen is not significantly distended. She has normal pitched bowel sounds.Will try an enema today. Possibly home tomorrow if she is able to move her bowels.  Waverly Ferrari, MD, FACS Beeper [phone] 06/27/2012

## 2012-06-27 NOTE — Progress Notes (Signed)
Pt. Ambulated approximately 130 ft behind a wheelchair on 3 liters of oxygen and accompanied by nurse. Ambulation was not well tolerated with complaint of back pain. Patient returned to chair with call bell in reach, refused pain medication, will continue to monitor.

## 2012-06-28 MED ORDER — OXYCODONE-ACETAMINOPHEN 5-325 MG PO TABS: 1.0000 | ORAL_TABLET | ORAL | Status: DC | PRN

## 2012-06-28 MED ORDER — FUROSEMIDE 10 MG/ML IJ SOLN
40.0000 mg | Freq: Once | INTRAMUSCULAR | Status: AC
  Administered 2012-06-28: 40 mg via INTRAVENOUS
  Filled 2012-06-28: qty 4

## 2012-06-28 NOTE — Progress Notes (Signed)
Able to wean patient down to 1 liter 02 via nasal cannula.  Patient ambulated 100 ft on room air , complained of feeling "shortwinded". 02 sats 88% on RA.  02 reapplied at 1 liter and 02 sat is 96%.  Will continue to monitor patient

## 2012-06-28 NOTE — Progress Notes (Signed)
Occupational Therapy Treatment Patient Details Name: Lindsay [name] MRN: [id number] DOB: Jul 08, 1945 Today's Date: 06/28/2012 Time: 6213-0865 OT Time Calculation (min): 24 min  OT Assessment / Plan / Recommendation Comments on Treatment Session Pt making good progress, however, limited by fatigue. Pt ambulated @ room  on RA wit c/o SOB and O2 SATs drpomg tp 89.. 1L appled and O2 increases and stays 93-95 while ambualting @ untit.    Follow Up Recommendations  Home health OT;Supervision/Assistance - 24 hour    Barriers to Discharge       Equipment Recommendations  3 in 1 bedside comode    Recommendations for Other Services    Frequency Min 2X/week   Plan Discharge plan needs to be updated;Discharge plan remains appropriate    Precautions / Restrictions Precautions Precautions: Fall Precaution Comments: O2 - check SATS   Pertinent Vitals/Pain O2 SATS RA ambulating @ room drpo to 89.  aMbulated @ unti 1 L O2 SATS remaining 93-95.    ADL  Toilet Transfer: Radiographer, therapeutic Method: Sit to stand;Stand pivot Acupuncturist: Materials engineer and Hygiene: Supervision/safety Where Assessed - Engineer, mining and Hygiene: Sit to stand from 3-in-1 or toilet Transfers/Ambulation Related to ADLs: S ADL Comments: Limited by fatigue and pain    OT Diagnosis:    OT Problem List:   OT Treatment Interventions:     OT Goals Acute Rehab OT Goals OT Goal Formulation: With patient Time For Goal Achievement: 07/02/12 Potential to Achieve Goals: Good ADL Goals Pt Will Perform Grooming: with supervision;Standing at sink ADL Goal: Grooming - Progress: Progressing toward goals Pt Will Transfer to Toilet: with supervision;Ambulation;with DME;Comfort height toilet;3-in-1 ADL Goal: Toilet Transfer - Progress: Met Pt Will Perform Toileting - Clothing Manipulation: with supervision;Standing ADL Goal: Toileting - Clothing  Manipulation - Progress: Met Pt Will Perform Toileting - Hygiene: with supervision;Sit to stand from 3-in-1/toilet ADL Goal: Toileting - Hygiene - Progress: Met Miscellaneous OT Goals Miscellaneous OT Goal #1: Pt will perform bathing/dressing ADLs at supervision level with AE as needed. OT Goal: Miscellaneous Goal #1 - Progress: Progressing toward goals Miscellaneous OT Goal #2: Pt will perform static sitting balance task EOB >10 min at supervision level as precursor for ADL retraining. OT Goal: Miscellaneous Goal #2 - Progress: Met  Visit Information  Last OT Received On: 06/28/12 Assistance Needed: +1    Subjective Data      Prior Functioning       Cognition  Cognition Overall Cognitive Status: Appears within functional limits for tasks assessed/performed Arousal/Alertness: Awake/alert Orientation Level: Appears intact for tasks assessed Behavior During Session: [facility] for tasks performed    Mobility  Transfers Transfers: Sit to Stand;Stand to Sit Sit to Stand: 5: Supervision Stand to Sit: 5: Supervision Details for Transfer Assistance: good safety    Exercises      Balance     End of Session OT - End of Session Equipment Utilized During Treatment: Gait belt (RW) Activity Tolerance: Patient limited by fatigue Patient left: in chair;with call bell/phone within reach Nurse Communication: Other (comment) (O2 SATS)  GO     Lindsay [name],Lindsay [name] 06/28/2012, 5:58 PM [facility], OTR/L  [phone] 06/28/2012

## 2012-06-28 NOTE — Progress Notes (Signed)
VASCULAR PROGRESS NOTE  SUBJECTIVE: No nausea this AM. Had BM after enema yesterday. Still SOB without oxygen.   PHYSICAL EXAM: Filed Vitals:   06/27/12 2341 06/28/12 0204 06/28/12 0525 06/28/12 0648  BP: 97/80  127/57 119/58  Pulse:   81 79  Temp:   98.3 F (36.8 C)   TempSrc:   Oral   Resp:   16   Height:      Weight:  143 lb 14.4 oz (65.273 kg)    SpO2:   98%    Lungs: decreased BS at bases.  Incisions look fine Abdomen with normal pitch bowel sounds.  Palpable pedal pulses.   LABS: Lab Results  Component Value Date   WBC 8.3 06/25/2012   HGB 9.3* 06/25/2012   HCT 27.8* 06/25/2012   MCV 87.4 06/25/2012   PLT 248 06/25/2012   Lab Results  Component Value Date   CREATININE 0.85 06/27/2012   Lab Results  Component Value Date   INR 1.49 06/17/2012   CBG (last 3)  No results found for this basename: GLUCAP,  in the last 72 hours  Active Problems:   Acute respiratory failure   COPD (chronic obstructive pulmonary disease)   Hypotension  ASSESSMENT AND PLAN: 1. 11 Days Post-Op s/p: Aortobifemoral bypass 2. Now tolerating regular diet and has had BM now. 3. Ambulating. 4. Only remaining issue is her oxygen. Sat drops into the high 80's. Will give more lasix today and try to ween off of O2. If unable to ween may need to go on home O2. Home later today or tomorrow.   Cari Caraway Beeper: [phone] 06/28/2012

## 2012-06-28 NOTE — Discharge Summary (Signed)
[facility] Discharge Summary   Patient ID:  Lindsay [name] MRN: [id number] DOB/AGE: [birth year] 67 y.o.  Admit date: 06/17/2012 Discharge date: 06/28/2012 Date of Surgery: 06/17/2012 Surgeon: Surgeon(s): Sherren Kerns, MD Dara Lords, MD  Admission Diagnosis: AORTA ILIAC DISEASE ovarian cyst  Discharge Diagnoses:  AORTA ILIAC DISEASE ovarian cyst  Secondary Diagnoses: Past Medical History  Diagnosis Date  . Abdominal aortic stenosis   . 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 H/O hiatal hernia   . Shortness of breath     pt states food gets stuck occasionally and she can't breath so has inhalerlSOB with exertion  . History of bronchitis     early 2014  . Pneumonia     hx of;last time in early 2013  . Headache   . Vertigo   . Hand numbness     left hand  . Chronic back pain     low back  . Dry skin   . Esophageal reflux     takes Nexium daily  . Constipation   . Urinary incontinence   . Cataracts, bilateral     immature  . Depression     doesn't take any medication  . Anxiety     doesn't take any medications  . Insomnia     Procedure(s):  LEFT OOPHORECTOMY AORTOILIAC BYPASS AORTIC ENDARTERECETOMY  Discharged Condition: good  HPI:  Patient is a 67 y.o. year old female who presents for evaluation of lower extremity claudication. She was seen by Dr. Terrilee Croak recently for evaluation of leg weakness. She was found on MRI scan to have evidence of aortic stenosis in the abdominal portion. The patient states she has been having difficulty walking more than 50-100 feet for the last 6 months. She denies rest pain. She denies numbness or tingling in her feet. She has no history of hypertension elevated cholesterol or diabetes. She does smoke approximately 1/2 to one pack of cigarettes per day.  She was sent for a CT angiogram of the abdomen and pelvis. This was also done with runoff. She returns today for  further followup. She has also seen Dr. Huston Foley and Dr. Eden Emms for cardiology evaluation. Her stress test showed no ischemia. Unfortunately she continues to smoke. Smoking cessation was again emphasized today. She continues to have bilateral lower extremity claudication symptoms at short walking distance. She was admitted for possible aortic endarterectomy vs aortobifem bypass.  Hospital Course:  Lindsay [name] is a 67 y.o. female is S/P LEFT OOPHORECTOMY AORTObifemoral BYPASS AORTIC ENDARTERECETOMY Extubated: POD # 0 Physical exam: abdomen soft, NT, NABS positive BM Post-op wounds healing well Pt. Ambulating, voiding and taking PO diet without difficulty. Pt pain controlled with PO pain meds. Labs as below Complications: post-op ileus COPD issues requiring O2  Significant Diagnostic Studies: CBC Lab Results  Component Value Date   WBC 8.3 06/25/2012   HGB 9.3* 06/25/2012   HCT 27.8* 06/25/2012   MCV 87.4 06/25/2012   PLT 248 06/25/2012    BMET    Component Value Date/Time   NA 140 06/27/2012 0550   K 3.6 06/27/2012 0550   CL 100 06/27/2012 0550   CO2 36* 06/27/2012 0550   GLUCOSE 104* 06/27/2012 0550   BUN 5* 06/27/2012 0550   CREATININE 0.85 06/27/2012 0550   CALCIUM 8.1* 06/27/2012 0550   GFRNONAA 69* 06/27/2012 0550   GFRAA 80* 06/27/2012 0550   COAG Lab Results  Component  Value Date   INR 1.49 06/17/2012   INR 0.85 06/16/2012     Disposition:  Discharge to :Home Discharge Orders   Future Orders Complete By Expires     ABDOMINAL PROCEDURE/ANEURYSM REPAIR/AORTO-BIFEMORAL BYPASS:  Call MD for increased abdominal pain; cramping diarrhea; nausea/vomiting  As directed     Call MD for:  redness, tenderness, or signs of infection (pain, swelling, bleeding, redness, odor or green/yellow discharge around incision site)  As directed     Call MD for:  severe or increased pain, loss or decreased feeling  in affected limb(s)  As directed     Call MD for:  temperature >100.5  As directed      Driving Restrictions  As directed     Comments:      No driving for 4 weeks    Increase activity slowly  As directed     Comments:      Walk with assistance use walker or cane as needed    Lifting restrictions  As directed     Comments:      No lifting for 6 weeks    May shower   As directed     No dressing needed  As directed     Resume previous diet  As directed     may wash over wound with mild soap and water  As directed         Medication List    STOP taking these medications       HYDROcodone-acetaminophen 5-325 MG per tablet  Commonly known as:  NORCO/VICODIN      TAKE these medications       albuterol 108 (90 BASE) MCG/ACT inhaler  Commonly known as:  PROVENTIL HFA;VENTOLIN HFA  Inhale 2 puffs into the lungs every 6 (six) hours as needed for wheezing. For wheezing     aspirin EC 81 MG tablet  Take 81 mg by mouth daily.     esomeprazole 40 MG capsule  Commonly known as:  NEXIUM  Take 40 mg by mouth daily before breakfast.     oxyCODONE-acetaminophen 5-325 MG per tablet  Commonly known as:  PERCOCET/ROXICET  Take 1-2 tablets by mouth every 4 (four) hours as needed.       Verbal and written Discharge instructions given to the patient. Wound care per Discharge AVS     Follow-up Information   Follow up with Sherren Kerns, MD In 2 weeks. (office will arrange - sent)    Contact information:   [address] [place] [address] [phone]       Signed: [name] 06/28/2012, 8:49 AM

## 2012-06-29 ENCOUNTER — Telehealth: Payer: Self-pay | Admitting: Vascular Surgery

## 2012-06-29 LAB — BASIC METABOLIC PANEL
BUN: 6 mg/dL (ref 6–23)
CO2: 34 mEq/L — ABNORMAL HIGH (ref 19–32)
Chloride: 97 mEq/L (ref 96–112)
Creatinine, Ser: 0.95 mg/dL (ref 0.50–1.10)
Potassium: 3.5 mEq/L (ref 3.5–5.1)

## 2012-06-29 MED ORDER — ENSURE COMPLETE PO LIQD
237.0000 mL | Freq: Three times a day (TID) | ORAL | Status: DC
  Administered 2012-06-29 – 2012-07-01 (×6): 237 mL via ORAL

## 2012-06-29 MED ORDER — ENSURE PUDDING PO PUDG
1.0000 | Freq: Three times a day (TID) | ORAL | Status: DC
  Administered 2012-06-29 – 2012-06-30 (×3): 1 via ORAL

## 2012-06-29 MED ORDER — POTASSIUM CHLORIDE CRYS ER 20 MEQ PO TBCR
20.0000 meq | EXTENDED_RELEASE_TABLET | Freq: Two times a day (BID) | ORAL | Status: DC
  Administered 2012-06-29 – 2012-07-01 (×5): 20 meq via ORAL
  Filled 2012-06-29 (×7): qty 1

## 2012-06-29 MED ORDER — FUROSEMIDE 10 MG/ML IJ SOLN
40.0000 mg | Freq: Two times a day (BID) | INTRAMUSCULAR | Status: DC
  Administered 2012-06-29 – 2012-07-01 (×5): 40 mg via INTRAVENOUS
  Filled 2012-06-29 (×7): qty 4

## 2012-06-29 NOTE — Progress Notes (Addendum)
At 1056: Checked pt's oxygen level. On 1L she was at 95%. I took her off oxygen and she fell to 92% on RA in bed at rest HR at 74. Up to [facility] her oxygen remained at 92% on RA HR of 83.   At 1516: Ambulated pt down hall and around the circle. At rest pt's oxygen saturation was 93% on RA. With ambulation pt dropped down to 88%. Put pt back on 2L in bed to get oxygen saturation back into 90's. Will continue to monitor.

## 2012-06-29 NOTE — Telephone Encounter (Addendum)
Message copied by Fredrich Birks on Mon Jun 29, 2012  9:41 AM ------      Message from: Marlowe Shores      Created: Sun Jun 28, 2012  9:04 AM       1-2 week F/U for staple removal - aortobifem - fields ------  06/29/12: spoke with pts spouse Ron to schedule, dpm

## 2012-06-29 NOTE — Progress Notes (Signed)
Physical Therapy Treatment Patient Details Name: Lindsay [name] MRN: [id number] DOB: 04-28-1945 Today's Date: 06/29/2012 Time: 4098-1191 PT Time Calculation (min): 35 min  PT Assessment / Plan / Recommendation Comments on Treatment Session  Pt s/p aortobifemoral BPG with continued limited mobility due to decr endurance and pain. Pt to benefit from stair training and continued monitoring of SaO2 with activity.    Follow Up Recommendations  Home health PT;Supervision for mobility/OOB     Does the patient have the potential to tolerate intense rehabilitation     Barriers to Discharge        Equipment Recommendations  Rolling walker with 5" wheels    Recommendations for Other Services    Frequency Min 3X/week   Plan Discharge plan remains appropriate;Frequency remains appropriate    Precautions / Restrictions Precautions Precautions: Fall Precaution Comments: O2 - check SATS   Pertinent Vitals/Pain SaO2 1L 93% with activity SaO2 on RA 89-90% with activity HR 76-84 Reports chronic back pain (not rated); repositioned with pillow under her knees at end of session    Mobility  Bed Mobility Bed Mobility: Rolling Left;Left Sidelying to Sit;Sitting - Scoot to Edge of Bed;Sit to Sidelying Left Rolling Left: 6: Modified independent (Device/Increase time) Left Sidelying to Sit: 6: Modified independent (Device/Increase time);With rails;HOB elevated Sitting - Scoot to Edge of Bed: 6: Modified independent (Device/Increase time) Sit to Sidelying Left: 6: Modified independent (Device/Increase time) Details for Bed Mobility Assistance: no cues or assist needed Transfers Transfers: Sit to Stand;Stand to Sit Sit to Stand: 5: Supervision Stand to Sit: 5: Supervision Details for Transfer Assistance: x2: vc for safe use of RW/hand placement Ambulation/Gait Ambulation/Gait Assistance: 5: Supervision Ambulation Distance (Feet): 300 Feet Assistive device: Rolling walker Ambulation/Gait  Assistance Details: Steady, cuing for safety only when washing hands at sink (RW positioning) and as pt approached bed (she tried to "park" RW at end of bed and walk without it). Assessed with and without O2 (see note) with SaO2 89-90%. Pt reports husband can assist her up her stairs on d/c. Will plan stair training for next visit. Gait Pattern: Step-through pattern;Decreased stride length Gait velocity: decreased    Exercises     PT Diagnosis:    PT Problem List:   PT Treatment Interventions:     PT Goals Acute Rehab PT Goals Pt will go Supine/Side to Sit: Independently PT Goal: Supine/Side to Sit - Progress: Progressing toward goal Pt will go Sit to Stand: with supervision;with upper extremity assist PT Goal: Sit to Stand - Progress: Met Pt will Transfer Bed to Chair/Chair to Bed: with supervision PT Transfer Goal: Bed to Chair/Chair to Bed - Progress: Met Pt will Ambulate: 51 - 150 feet;with supervision;with least restrictive assistive device PT Goal: Ambulate - Progress: Met  Visit Information  Last PT Received On: 06/29/12 Assistance Needed: +1    Subjective Data  Subjective: Reports unable to get comfortable in bed or recliner due to back pain   Cognition  Cognition Overall Cognitive Status: Appears within functional limits for tasks assessed/performed Arousal/Alertness: Awake/alert Orientation Level: Appears intact for tasks assessed Behavior During Session: [facility] for tasks performed    Balance     End of Session PT - End of Session Equipment Utilized During Treatment: Oxygen Activity Tolerance: Patient tolerated treatment well Patient left: in bed;with call bell/phone within reach   GP     Lindsay [name] 06/29/2012, 9:52 AM Pager [phone]

## 2012-06-29 NOTE — Progress Notes (Signed)
[facility] [place]  Subjective  - Feels swollen   Objective 120/50 83 98.4 F (36.9 C) (Oral) 16 91%  Intake/Output Summary (Last 24 hours) at 06/29/12 1008 Last data filed at 06/29/12 0421  Gross per 24 hour  Intake   1485 ml  Output   1800 ml  Net   -315 ml   Extremities- feet warm 2+ pitting edema Abdomen- incisions healing no drainage or erythema Chest- clear to auscultation, still with 2L O2 requirement desats fairly easily with activity  Assessment/Planning: S/P aortobifem and ovarian cyst removal Still with hypervolemia will continue diuresis However, some of the edema may be secondary to protein calorie malnutrition as well with albumin in the 2 range.  Will supplement with ensure and liberalize diet Continue pulmonary toilet and ambulation May need to go home on oxygen for a while if unable to wean after diuresis  Guenther Dunshee E 06/29/2012 10:08 AM --  Laboratory Lab Results: No results found for this basename: WBC, HGB, HCT, PLT,  in the last 72 hours BMET  Recent Labs  06/27/12 0550 06/29/12 0535  NA 140 139  K 3.6 3.5  CL 100 97  CO2 36* 34*  GLUCOSE 104* 103*  BUN 5* 6  CREATININE 0.85 0.95  CALCIUM 8.1* 8.7    COAG Lab Results  Component Value Date   INR 1.49 06/17/2012   INR 0.85 06/16/2012   No results found for this basename: PTT

## 2012-06-29 NOTE — Progress Notes (Signed)
SATURATION QUALIFICATIONS: (This note is used to comply with regulatory documentation for home oxygen)  Patient Saturations on Room Air at Rest = 93%  Patient Saturations on Room Air while Ambulating = 89%  Patient Saturations on 1 Liters of oxygen while Ambulating = 93%  Please briefly explain why patient needs home oxygen: currently does not qualify

## 2012-06-30 LAB — URINALYSIS, ROUTINE W REFLEX MICROSCOPIC
Glucose, UA: NEGATIVE mg/dL
Hgb urine dipstick: NEGATIVE
Leukocytes, UA: NEGATIVE
Protein, ur: NEGATIVE mg/dL
pH: 7.5 (ref 5.0–8.0)

## 2012-06-30 LAB — BASIC METABOLIC PANEL WITH GFR
BUN: 9 mg/dL (ref 6–23)
CO2: 35 meq/L — ABNORMAL HIGH (ref 19–32)
Calcium: 8.7 mg/dL (ref 8.4–10.5)
Chloride: 97 meq/L (ref 96–112)
Creatinine, Ser: 0.97 mg/dL (ref 0.50–1.10)
GFR calc Af Amer: 68 mL/min — ABNORMAL LOW
GFR calc non Af Amer: 59 mL/min — ABNORMAL LOW
Glucose, Bld: 106 mg/dL — ABNORMAL HIGH (ref 70–99)
Potassium: 3.8 meq/L (ref 3.5–5.1)
Sodium: 141 meq/L (ref 135–145)

## 2012-06-30 MED ORDER — SENNOSIDES-DOCUSATE SODIUM 8.6-50 MG PO TABS
1.0000 | ORAL_TABLET | Freq: Two times a day (BID) | ORAL | Status: DC
  Administered 2012-06-30 (×2): 1 via ORAL
  Filled 2012-06-30 (×5): qty 1

## 2012-06-30 NOTE — Progress Notes (Signed)
PATIENT HAS BEEN VDING SMALL FREG AMT. OF URINE. BALDDER SCAN ZERO ML.

## 2012-06-30 NOTE — Progress Notes (Signed)
Pt ambulated approx. 500 feet with rolling walker and 2L [place]. Pt tolerated well.

## 2012-06-30 NOTE — Progress Notes (Signed)
PT Cancellation Note  Patient Details Name: Lindsay [name] MRN: [id number] DOB: 29-Sep-1945   Cancelled Treatment:    Reason Eval/Treat Not Completed: Other (comment) (Pt with OT 1st attempt, refused 2nd attempt due to fatigue).    INGOLD,Ronique Simerly 06/30/2012, 12:05 PM [facility] [phone] (pager)

## 2012-06-30 NOTE — Progress Notes (Signed)
Pt ambulated approx. 400 feet on 2L O2 and with rolling walker. Pt tolerated well. Pt resting in chair with call bell in reach.

## 2012-06-30 NOTE — Progress Notes (Signed)
SATURATION QUALIFICATIONS: (This note is used to comply with regulatory documentation for home oxygen)  Patient Saturations on Room Air at Rest = 88%  Patient Saturations on Room Air while Ambulating = 86%  Patient Saturations on 2L Liters of oxygen while Ambulating = 94%  Please briefly explain why patient needs home oxygen:

## 2012-06-30 NOTE — Progress Notes (Addendum)
[facility] [place]  Post-op  Intra-abdominal Surgery note  Date of Surgery: 06/17/2012  Surgeon(s): Sherren Kerns, MD Dara Lords, MD  13 Days Post-Op Procedure(s):  LEFT OOPHORECTOMY AORTObifem BYPASS AORTIC ENDARTERECETOMY  History of Present Illness  Lindsay [name] is a 67 y.o. female who is  13 days post-op.denies incisional pain; denies nausea/vomiting; denies diarrhea. has had flatus;has had BM C/O back pain and some burning with urination. Diuresed yesterday  IMAGING: No results found.  Significant Diagnostic Studies: CBC Lab Results  Component Value Date   WBC 8.3 06/25/2012   HGB 9.3* 06/25/2012   HCT 27.8* 06/25/2012   MCV 87.4 06/25/2012   PLT 248 06/25/2012    BMET    Component Value Date/Time   NA 141 06/30/2012 0456   K 3.8 06/30/2012 0456   CL 97 06/30/2012 0456   CO2 35* 06/30/2012 0456   GLUCOSE 106* 06/30/2012 0456   BUN 9 06/30/2012 0456   CREATININE 0.97 06/30/2012 0456   CALCIUM 8.7 06/30/2012 0456   GFRNONAA 59* 06/30/2012 0456   GFRAA 68* 06/30/2012 0456    COAG Lab Results  Component Value Date   INR 1.49 06/17/2012   INR 0.85 06/16/2012   No results found for this basename: PTT    I/O last 3 completed shifts: In: 1200 [P.O.:1200] Out: 2500 [Urine:2500]    Physical Examination BP Readings from Last 3 Encounters:  06/30/12 100/49  06/30/12 100/49  06/30/12 100/49   Temp Readings from Last 3 Encounters:  06/30/12 98.4 F (36.9 C) Oral  06/30/12 98.4 F (36.9 C) Oral  06/30/12 98.4 F (36.9 C) Oral   SpO2 Readings from Last 3 Encounters:  06/30/12 96%  06/30/12 96%  06/30/12 96%   Pulse Readings from Last 3 Encounters:  06/30/12 80  06/30/12 80  06/30/12 80    General: A&O x 3, WDWN female in NAD Pulmonary: normal non-labored breathing ,  Cardiac: Heart rate : regular ,  Abdomen:abdomen soft, non-tender and normal active bowel sounds Abdominal wound:clean, dry, intact  Neurologic: A&O X 3;  Appropriate Affect ;  SENSATION: normal;  MOTOR FUNCTION:  moving all extremities equally.  Speech is fluent/normal Vascular Exam:BLE warm and well perfused 1+ pitting edema - much improved from yesterday Extremities without ischemic changes, no Gangrene, no cellulitis; no open wounds;   Assessment/Plan: Lindsay [name] is a 67 y.o. female who is 13 Days Post-Op Procedure(s):  LEFT OOPHORECTOMY AORTObifem BYPASS AORTIC ENDARTERECETOMY O2 sats and pitting edema better after diuresis Back pain - will check UA as pt c/o burning with urination Ambulate       Lindsay [name] [phone] 06/30/2012 7:56 AM   Incisions all healing.  Feet warm and perfused. Complaining of some back pain.  Agree with checking urine but she also has chronic back pain which I am sure has been made worse by her inactivity.  Emphasized to patient that she needs to be up and out of bed.  Peripheral edema continues to improve.  Protein calorie malnutrition will improve with time.  She is drinking ensure in addition to regular diet.  Will plan on d/c home tomorrow.  Will arrange home O2 if necessary.  D/c Lasix and potassium prior to discharge home.   Fabienne Bruns, MD [facility] [place] Office: [phone] Pager: [phone]

## 2012-06-30 NOTE — Progress Notes (Signed)
Occupational Therapy Treatment Patient Details Name: Lindsay [name] MRN: [id number] DOB: 29-Sep-1945 Today's Date: 06/30/2012 Time: 4098-1191 OT Time Calculation (min): 38 min  OT Assessment / Plan / Recommendation Comments on Treatment Session Improved endurance in standing for ADL.  02 sats noted to drop to 88% on RA during activity, rebounded quickly with return of 02 to 96%.  Pt somewhat limited back back pain.    Follow Up Recommendations  Home health OT;Supervision/Assistance - 24 hour    Barriers to Discharge       Equipment Recommendations  3 in 1 bedside comode    Recommendations for Other Services    Frequency Min 2X/week   Plan Discharge plan remains appropriate    Precautions / Restrictions Precautions Precautions: Fall Precaution Comments: O2 - check SATS Restrictions Weight Bearing Restrictions: No   Pertinent Vitals/Pain Back pain, did not rate, repositioned, RN aware    ADL  Grooming: Wash/dry hands;Wash/dry face;Brushing hair;Denture care;Teeth care;Supervision/safety Where Assessed - Grooming: Unsupported standing Toilet Transfer: Radiographer, therapeutic Method: Sit to Barista: Raised toilet seat with arms (or 3-in-1 over toilet) Toileting - Clothing Manipulation and Hygiene: Supervision/safety Where Assessed - Glass blower/designer Manipulation and Hygiene: Standing Equipment Used: Rolling walker Transfers/Ambulation Related to ADLs: supervision with RW ADL Comments: Pt stood x 10 minutes at sink.      OT Diagnosis:    OT Problem List:   OT Treatment Interventions:     OT Goals ADL Goals Pt Will Perform Grooming: with supervision;Standing at sink ADL Goal: Grooming - Progress: Met Pt Will Transfer to Toilet: with supervision;Ambulation;with DME;Comfort height toilet;3-in-1 ADL Goal: Toilet Transfer - Progress: Met Pt Will Perform Toileting - Clothing Manipulation: with supervision;Standing ADL Goal: Toileting -  Clothing Manipulation - Progress: Met Pt Will Perform Toileting - Hygiene: with supervision;Sit to stand from 3-in-1/toilet ADL Goal: Toileting - Hygiene - Progress: Met Miscellaneous OT Goals Miscellaneous OT Goal #1: Pt will perform bathing/dressing ADLs at supervision level with AE as needed. Miscellaneous OT Goal #2: Pt will perform static sitting balance task EOB >10 min at supervision level as precursor for ADL retraining.  Visit Information  Last OT Received On: 06/30/12 Assistance Needed: +1    Subjective Data      Prior Functioning       Cognition  Cognition Overall Cognitive Status: Appears within functional limits for tasks assessed/performed Arousal/Alertness: Awake/alert Orientation Level: Appears intact for tasks assessed Behavior During Session: [facility] for tasks performed    Mobility  Bed Mobility Bed Mobility: Not assessed Transfers Transfers: Sit to Stand;Stand to Sit Sit to Stand: With upper extremity assist;From chair/3-in-1;6: Modified independent (Device/Increase time) Stand to Sit: 6: Modified independent (Device/Increase time);With upper extremity assist;To chair/3-in-1    Exercises      Balance Balance Balance Assessed: Yes Static Sitting Balance Static Sitting - Comment/# of Minutes: 10 Static Standing Balance Static Standing - Balance Support: No upper extremity supported;During functional activity Static Standing - Level of Assistance: 5: Stand by assistance Static Standing - Comment/# of Minutes: 10   End of Session OT - End of Session Activity Tolerance: Patient limited by fatigue;Patient limited by pain Patient left: in chair;with call bell/phone within reach Nurse Communication: Patient requests pain meds (02 sats dropped to 88% on RA)  GO     Evern Bio 06/30/2012, 11:05 AM [phone]

## 2012-07-01 ENCOUNTER — Telehealth: Payer: Self-pay | Admitting: Vascular Surgery

## 2012-07-01 LAB — BASIC METABOLIC PANEL
BUN: 12 mg/dL (ref 6–23)
CO2: 38 mEq/L — ABNORMAL HIGH (ref 19–32)
Chloride: 93 mEq/L — ABNORMAL LOW (ref 96–112)
Creatinine, Ser: 1.15 mg/dL — ABNORMAL HIGH (ref 0.50–1.10)
GFR calc Af Amer: 55 mL/min — ABNORMAL LOW (ref >90)
Glucose, Bld: 108 mg/dL — ABNORMAL HIGH (ref 70–99)
Potassium: 3.9 mEq/L (ref 3.5–5.1)

## 2012-07-01 LAB — CBC
HCT: 29 % — ABNORMAL LOW (ref 36.0–46.0)
Hemoglobin: 9.5 g/dL — ABNORMAL LOW (ref 12.0–15.0)
MCV: 87.3 fL (ref 78.0–100.0)
RDW: 14.3 % (ref 11.5–15.5)
WBC: 10.7 10*3/uL — ABNORMAL HIGH (ref 4.0–10.5)

## 2012-07-01 MED ORDER — TRAMADOL HCL 50 MG PO TABS: 50.0000 mg | ORAL_TABLET | Freq: Four times a day (QID) | ORAL | Status: DC | PRN

## 2012-07-01 MED ORDER — ENSURE PUDDING PO PUDG: 1.0000 | Freq: Three times a day (TID) | ORAL | Status: DC

## 2012-07-01 MED ORDER — SENNOSIDES-DOCUSATE SODIUM 8.6-50 MG PO TABS: 1.0000 | ORAL_TABLET | Freq: Two times a day (BID) | ORAL | Status: DC

## 2012-07-01 MED ORDER — ENSURE COMPLETE PO LIQD: 237.0000 mL | Freq: Three times a day (TID) | ORAL | Status: DC

## 2012-07-01 NOTE — Discharge Summary (Signed)
[facility] Discharge Summary   Patient ID:  Lindsay [name] MRN: [id number] DOB/AGE: 69-Mar-1947 68 y.o.  Admit date: 06/17/2012 Discharge date: 07/01/2012 Date of Surgery: 06/17/2012 Surgeon: Surgeon(s): Sherren Kerns, MD Dara Lords, MD  Admission Diagnosis: AORTA ILIAC DISEASE ovarian cyst  Discharge Diagnoses:  AORTA ILIAC DISEASE ovarian cyst  Secondary Diagnoses: Past Medical History  Diagnosis Date  . Abdominal aortic stenosis   . 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 H/O hiatal hernia   . Shortness of breath     pt states food gets stuck occasionally and she can't breath so has inhalerlSOB with exertion  . History of bronchitis     early 2014  . Pneumonia     hx of;last time in early 2013  . Headache   . Vertigo   . Hand numbness     left hand  . Chronic back pain     low back  . Dry skin   . Esophageal reflux     takes Nexium daily  . Constipation   . Urinary incontinence   . Cataracts, bilateral     immature  . Depression     doesn't take any medication  . Anxiety     doesn't take any medications  . Insomnia     Procedure(s):  LEFT OOPHORECTOMY AORTObifem BYPASS AORTIC ENDARTERECETOMY  Discharged Condition: good  HPI:  Patient is a 67 y.o. year old female who presents for evaluation of lower extremity claudication. She was seen by Dr. Terrilee Croak recently for evaluation of leg weakness. She was found on MRI scan to have evidence of aortic stenosis in the abdominal portion. The patient states she has been having difficulty walking more than 50-100 feet for the last 6 months. She denies rest pain. She denies numbness or tingling in her feet. She has no history of hypertension elevated cholesterol or diabetes. She does smoke approximately 1/2 to one pack of cigarettes per day. Greater than 3 minutes they were spent regarding smoking cessation counseling. She is interested in quitting. She has  had some dysphagia but states that she thinks this is related to her hiatal hernia and reflux. She denies food fear. She denies weight loss. She gets chest pain with exertion but again blames this on her reflux. She becomes short of breath climbing more than 2 flights of stairs. She states she did have a stress test in the past at [facility] This is not available for review today. The MRI report is available the images are not available for viewing. She also complains of recent numbness and tingling in the digits of her left hand 34 and 5. This is intermittent in nature.. Other medical problems include reflux COPD and arthritis all of which are currently stable.    Hospital Course:  Lindsay [name] is a 67 y.o. female is S/P   LEFT OOPHORECTOMY- serous cystadenoma No malignancy noted on path report AORTObifem BYPASS AORTIC ENDARTERECETOMY Extubated: POD # 0 Physical exam: abd soft with NABS Lungs clear No more pitting edema Post-op wounds clean, dry, intact and healing well Staples removed prior to DC Pt. Ambulating, voiding and taking PO diet without difficulty. Pt pain controlled with PO pain meds. Labs as below Complications: Post-op ileus Post-op resp insuff sec to smoking - home O2 and in hosp diuresis  Path: LEFT OOPHORECTOMY- serous cystadenoma No malignancy noted on path report for ovarian cyst or cytology of fluid  Significant Diagnostic Studies: CBC Lab Results  Component Value Date   WBC 10.7* 07/01/2012   HGB 9.5* 07/01/2012   HCT 29.0* 07/01/2012   MCV 87.3 07/01/2012   PLT 372 07/01/2012    BMET    Component Value Date/Time   NA 137 07/01/2012 0552   K 3.9 07/01/2012 0552   CL 93* 07/01/2012 0552   CO2 38* 07/01/2012 0552   GLUCOSE 108* 07/01/2012 0552   BUN 12 07/01/2012 0552   CREATININE 1.15* 07/01/2012 0552   CALCIUM 9.0 07/01/2012 0552   GFRNONAA 48* 07/01/2012 0552   GFRAA 55* 07/01/2012 0552   COAG Lab Results  Component Value Date   INR 1.49  06/17/2012   INR 0.85 06/16/2012     Disposition:  Discharge to :Home with Home O2 and HH RN  Discharge Orders   Future Appointments Provider Department Dept Phone   07/16/2012 9:15 AM Sherren Kerns, MD [facility] [phone]   Future Orders Complete By Expires     ABDOMINAL PROCEDURE/ANEURYSM REPAIR/AORTO-BIFEMORAL BYPASS:  Call MD for increased abdominal pain; cramping diarrhea; nausea/vomiting  As directed     Call MD for:  redness, tenderness, or signs of infection (pain, swelling, bleeding, redness, odor or green/yellow discharge around incision site)  As directed     Call MD for:  severe or increased pain, loss or decreased feeling  in affected limb(s)  As directed     Call MD for:  temperature >100.5  As directed     Driving Restrictions  As directed     Comments:      No driving for 4 weeks    Increase activity slowly  As directed     Comments:      Walk with assistance use walker or cane as needed    Lifting restrictions  As directed     Comments:      No lifting for 6 weeks    May shower   As directed     No dressing needed  As directed     Resume previous diet  As directed     may wash over wound with mild soap and water  As directed         Medication List    STOP taking these medications       HYDROcodone-acetaminophen 5-325 MG per tablet  Commonly known as:  NORCO/VICODIN      TAKE these medications       albuterol 108 (90 BASE) MCG/ACT inhaler  Commonly known as:  PROVENTIL HFA;VENTOLIN HFA  Inhale 2 puffs into the lungs every 6 (six) hours as needed for wheezing. For wheezing     aspirin EC 81 MG tablet  Take 81 mg by mouth daily.     esomeprazole 40 MG capsule  Commonly known as:  NEXIUM  Take 40 mg by mouth daily before breakfast.     feeding supplement Liqd  Take 237 mLs by mouth 3 (three) times daily with meals.     feeding supplement Pudg  Take 1 Container by mouth 3 (three) times daily with meals.      oxyCODONE-acetaminophen 5-325 MG per tablet  Commonly known as:  PERCOCET/ROXICET  Take 1-2 tablets by mouth every 4 (four) hours as needed.     senna-docusate 8.6-50 MG per tablet  Commonly known as:  Senokot-S  Take 1 tablet by mouth 2 (two) times daily.     traMADol 50 MG tablet  Commonly known as:  ULTRAM  Take 1-2 tablets (50-100 mg total) by mouth every 6 (six) hours as needed for pain.       Verbal and written Discharge instructions given to the patient. Wound care per Discharge AVS     Follow-up Information   Follow up with Sherren Kerns, MD In 2 weeks. (office will arrange - sent)    Contact information:   [address] [place] [address] [phone]       Signed: Marlowe Shores 07/01/2012, 8:59 AM

## 2012-07-01 NOTE — Progress Notes (Signed)
Removed pt's abdominal staples per order. Pt tolerated well. Gave ice afterwards to place on site. Pt stated it was sore. Discharge instructions given to pt and pt's significant other along with prescriptions. Addressed all questions and new medications. Pt is stable for discharge. Pt has been given walker for home and will receive home O2 as well.

## 2012-07-01 NOTE — Progress Notes (Addendum)
Physical Therapy Treatment and D/C Patient Details Name: Lindsay [name] MRN: [id number] DOB: 1945/04/29 Today's Date: 07/01/2012 Time: 1478-2956 PT Time Calculation (min): 27 min  PT Assessment / Plan / Recommendation Comments on Treatment Session  Pt s/p aortobifemoral BPG that has met all inpatient PT goals and plans to d/c today.  Has needed continued monitoring of SaO2 with activity and plans to go home with O2. Pt and husband have no further questions and feel ready to d/c.      Follow Up Recommendations  Home health PT;Supervision for mobility/OOB                 Equipment Recommendations  Rolling walker with 5" wheels       Frequency     Plan Discharge plan remains appropriate    Precautions / Restrictions Precautions Precautions: None Precaution Comments: O2 - check SATS Restrictions Weight Bearing Restrictions: No   Pertinent Vitals/Pain VSS, No pain    Mobility  Bed Mobility Bed Mobility: Sit to Supine Rolling Right: 7: Independent Details for Bed Mobility Assistance: no cues or assist needed Transfers Transfers: Sit to Stand;Stand to Sit Sit to Stand: With upper extremity assist;From chair/3-in-1;6: Modified independent (Device/Increase time) Stand to Sit: 6: Modified independent (Device/Increase time);With upper extremity assist;To chair/3-in-1 Details for Transfer Assistance: no cues or assist needed Ambulation/Gait Ambulation/Gait Assistance: 6: Modified independent (Device/Increase time) Ambulation Distance (Feet): 450 Feet Assistive device: Rolling walker Ambulation/Gait Assistance Details: Pt ambulates safely with RW in controlled environment without LOB.  Still needs O2 at 2L to keep sats >90%.  Pt and husband refused that pt needs to practice stairs even though offered.   Gait Pattern: Step-through pattern;Decreased stride length Gait velocity: decreased Stairs: No (pt and husband declined) Corporate treasurer: No    PT  Goals Acute Rehab PT Goals PT Goal: Supine/Side to Sit - Progress: Met PT Goal: Sit at [place] Of Bed - Progress: Met PT Goal: Sit to Stand - Progress: Met PT Transfer Goal: Bed to Chair/Chair to Bed - Progress: Met PT Goal: Ambulate - Progress: Met PT Goal: Up/Down Stairs - Progress: Other (comment) (declined to practice) PT Goal: Perform Home Exercise Program - Progress: Met  Visit Information  Last PT Received On: 07/01/12 Assistance Needed: +1    Subjective Data  Subjective: "I am ready to go home.  I don't need to practice steps."   Cognition  Cognition Overall Cognitive Status: Appears within functional limits for tasks assessed/performed Arousal/Alertness: Awake/alert Orientation Level: Appears intact for tasks assessed Behavior During Session: [facility] for tasks performed    Balance  Static Sitting Balance Static Sitting - Level of Assistance: Not tested (comment) Static Standing Balance Static Standing - Balance Support: No upper extremity supported;During functional activity Static Standing - Level of Assistance: 5: Stand by assistance Static Standing - Comment/# of Minutes: 10 minutes at sink brshing hair and teeth without LOB.  End of Session PT - End of Session Equipment Utilized During Treatment: Gait belt;Oxygen Activity Tolerance: Patient tolerated treatment well Patient left: in bed;with call bell/phone within reach;with family/visitor present Nurse Communication: Mobility status        INGOLD,Ovila Lepage 07/01/2012, 11:27 AM [facility] [phone] (pager)

## 2012-07-01 NOTE — Telephone Encounter (Signed)
Message copied by Fredrich Birks on Wed Jul 01, 2012  1:54 PM ------      Message from: Melene Plan      Created: Wed Jul 01, 2012 10:20 AM                   ----- Message -----         From: Marlowe Shores, PA-C         Sent: 07/01/2012   9:00 AM           To: Melene Plan, RN, [facility]            Change appt to 1 month for fields ------

## 2012-07-01 NOTE — Telephone Encounter (Signed)
07/01/12: Left a detailed message about time frame change from 2 weeks to 1 month. dpm

## 2012-07-01 NOTE — Progress Notes (Addendum)
[facility] [place]  Post-op  Intra-abdominal Surgery note  Date of Surgery: 06/17/2012  Surgeon(s): Sherren Kerns, MD Dara Lords, MD  14 Days Post-Op Procedure(s):  LEFT OOPHORECTOMY AORTObifem BYPASS AORTIC ENDARTERECETOMY  History of Present Illness  Lindsay [name] is a 67 y.o. female who is 14 days post-op Pt is doing better. complains of incisional pain; denies nausea/vomiting; denies diarrhea. has had flatus;has had BM  IMAGING: No results found.  Significant Diagnostic Studies: CBC Lab Results  Component Value Date   WBC 10.7* 07/01/2012   HGB 9.5* 07/01/2012   HCT 29.0* 07/01/2012   MCV 87.3 07/01/2012   PLT 372 07/01/2012    BMET    Component Value Date/Time   NA 137 07/01/2012 0552   K 3.9 07/01/2012 0552   CL 93* 07/01/2012 0552   CO2 38* 07/01/2012 0552   GLUCOSE 108* 07/01/2012 0552   BUN 12 07/01/2012 0552   CREATININE 1.15* 07/01/2012 0552   CALCIUM 9.0 07/01/2012 0552   GFRNONAA 48* 07/01/2012 0552   GFRAA 55* 07/01/2012 0552    COAG Lab Results  Component Value Date   INR 1.49 06/17/2012   INR 0.85 06/16/2012   No results found for this basename: PTT    I/O last 3 completed shifts: In: 1560 [P.O.:1560] Out: 1600 [Urine:1600]    Physical Examination BP Readings from Last 3 Encounters:  07/01/12 97/47  07/01/12 97/47  07/01/12 97/47   Temp Readings from Last 3 Encounters:  07/01/12 98.8 F (37.1 C) Oral  07/01/12 98.8 F (37.1 C) Oral  07/01/12 98.8 F (37.1 C) Oral   SpO2 Readings from Last 3 Encounters:  07/01/12 97%  07/01/12 97%  07/01/12 97%   Pulse Readings from Last 3 Encounters:  07/01/12 82  07/01/12 82  07/01/12 82    General: A&O x 3, WDWN female in NAD Pulmonary: normal non-labored breathing , without Rales, rhonchi,  wheezing Cardiac: Heart rate : regular ,  Abdomen:abdomen soft, non-tender and normal active bowel sounds Abdominal wound:clean, dry, intact  Neurologic: A&O X 3;  Appropriate Affect ;  SENSATION: normal;  MOTOR FUNCTION:  moving all extremities equally.  Speech is fluent/normal BLE no pitting edema Vascular Exam:BLE warm and well perfused Extremities without ischemic changes, no Gangrene, no cellulitis; no open wounds;   Assessment/Plan: Lindsay [name] is a 67 y.o. female who is 14 Days Post-Op Procedure(s):  LEFT OOPHORECTOMY AORTObifem BYPASS AORTIC ENDARTERECETOMY Pt still requiring O2 will send home with O2 F/U 1 month Staples out today Labs stable      Marlowe Shores [phone] 07/01/2012 9:01 AM  POD 14 Abdomen incision and groin incisions healing no drainage or erythema Minor aches and pains Will need home O2 Hemoglobin stable Seems fully diuresed at this point based on exam and labwork  D/C home today  Fabienne Bruns, MD [facility] [place] Office: [phone] Pager: [phone]

## 2012-07-07 ENCOUNTER — Telehealth: Payer: Self-pay

## 2012-07-07 ENCOUNTER — Other Ambulatory Visit: Payer: Self-pay

## 2012-07-07 DIAGNOSIS — R110 Nausea: Secondary | ICD-10-CM

## 2012-07-07 MED ORDER — ONDANSETRON HCL 4 MG PO TABS: ORAL_TABLET | ORAL | Status: DC

## 2012-07-07 NOTE — Telephone Encounter (Signed)
Pt. Called to report persistent nausea since she was discharged from the hospital on 3/19.  Stated she has been passing gas, but noticed yesterday this has slowed down.  Reports that the home health RN saw her today, and noted that her abdomen was a little distended.  Pt. States she has felt like vomiting a couple of times, but did not vomit.  Reports taking Senokot 1 daily, and having small bowel movement daily.  Reports difficulty swallowing.  States "the food starts to go down, and feels like it gets stuck about 1/2 way down".  Denies fever, but states " I stay cold all the time."  Requesting medication for nausea.  Advised pt. will discuss with Dr. Darrick Penna.

## 2012-07-07 NOTE — Telephone Encounter (Signed)
Discussed pt's. symptoms with Dr. Darrick Penna.  Recommended the following:  Zofran 4 mg po, 1 tab q 6 hr. prn/ nausea, # 30, no refills;  Have pt. take Fleets Enema x 2;  Discuss use of Ibuprofen with pt., and discourage relying on Ibuprofen, due to it's effect on the stomach;  Recommend pt. use her Oxycodone for pain; may order Vicodin, if pt. doesn't want to take Oxycodone.  Phone call returned to pt.; recommendations given per Dr. Darrick Penna.  Pt. Stated she doesn't like the way some of the pain medication makes her feel.  Stated the Oxycodone gives her very bad dreams.  Stated she hasn't taken any pain medication for 2 days.  Advised pt. she could try ES Tylenol for pain; gave her the recommendation not to exceed 8 tabs in 24 hrs, due to effect on liver.  Also advised to call office if symptoms not improved.  Verb. Understanding.

## 2012-07-09 ENCOUNTER — Telehealth: Payer: Self-pay

## 2012-07-09 NOTE — Telephone Encounter (Signed)
Phone call in follow up to check on pt's symptoms and current status.  Stated she is feeling better.  Denies any abdominal bloating.  Reports having BM x 2 today, and "normal size"; nausea is better; urinating in good amts. Reports that yesterday she was dribbling urine off and on throughout day.  Denies any burning with urination or urgency.  Reports that her left side of abdomen is sore.  States passing gas easily.  Reports that her physical therapy has been increased, and she feels the soreness is due to the increase in exercise.  Advised to report this to her Physical Therapist.  Advised to call office with worsening symptoms.  Verb. Understanding.

## 2012-07-16 ENCOUNTER — Ambulatory Visit: Payer: 59 | Admitting: Vascular Surgery

## 2012-07-17 ENCOUNTER — Other Ambulatory Visit: Payer: Self-pay | Admitting: *Deleted

## 2012-07-17 DIAGNOSIS — E785 Hyperlipidemia, unspecified: Secondary | ICD-10-CM

## 2012-07-17 MED ORDER — ATORVASTATIN CALCIUM 20 MG PO TABS: 20.0000 mg | ORAL_TABLET | Freq: Every day | ORAL | Status: AC

## 2012-07-23 ENCOUNTER — Encounter: Payer: Self-pay | Admitting: Gynecology

## 2012-07-23 ENCOUNTER — Ambulatory Visit (INDEPENDENT_AMBULATORY_CARE_PROVIDER_SITE_OTHER): Payer: 59 | Admitting: Gynecology

## 2012-07-23 DIAGNOSIS — Z9889 Other specified postprocedural states: Secondary | ICD-10-CM

## 2012-07-23 NOTE — Progress Notes (Signed)
Patient resents postoperative period she had an aortic endarterectomy with left salpingo-oophorectomy March 2014. She is recovering well. Her pathology showed a benign serous cystadenoma.  Exam Abdomen with healing large midline incision and 2 groin incisions. Some scabbing over the groin incisions. Otherwise abdomen is soft with minimal tenderness.  Assessment and plan: Benign serous cystadenoma left ovary. Reviewedpathology with her. Has not had a GYN checkup in 2 years or a mammogram. I recommended she schedule a baseline mammogram and follow up with me during the summer for an annual exam after she recovers from her vascular surgery.

## 2012-07-23 NOTE — Patient Instructions (Addendum)
Follow up this summer for your annual gynecologic exam

## 2012-08-05 ENCOUNTER — Encounter: Payer: Self-pay | Admitting: Vascular Surgery

## 2012-08-06 ENCOUNTER — Ambulatory Visit: Payer: 59 | Admitting: Vascular Surgery

## 2012-08-06 ENCOUNTER — Encounter: Payer: Self-pay | Admitting: Vascular Surgery

## 2012-08-06 ENCOUNTER — Ambulatory Visit (INDEPENDENT_AMBULATORY_CARE_PROVIDER_SITE_OTHER): Payer: Medicare PPO | Admitting: Vascular Surgery

## 2012-08-06 VITALS — BP 129/66 | HR 76 | Temp 98.0°F | Resp 16 | Ht 65.0 in | Wt 126.0 lb

## 2012-08-06 DIAGNOSIS — I739 Peripheral vascular disease, unspecified: Secondary | ICD-10-CM

## 2012-08-06 DIAGNOSIS — Z48812 Encounter for surgical aftercare following surgery on the circulatory system: Secondary | ICD-10-CM | POA: Insufficient documentation

## 2012-08-06 NOTE — Progress Notes (Signed)
Patient is a 67 year old female who returns for followup today after aortobifemoral on March 5. She also had an ovary removed by Dr. Audie Box at that same operation. She still reports various muscle aches and pains across her abdomen. Her appetite is improving although still not quite back to baseline. She states her walking distance is significantly improved. I congratulated her today since she has now quit smoking for almost 6 weeks.  Physical exam:  Filed Vitals:   08/06/12 1226  BP: 129/66  Pulse: 76  Temp: 98 F (36.7 C)  TempSrc: Oral  Resp: 16  Height: 5\' 5"  (1.651 m)  Weight: 126 lb (57.153 kg)  SpO2: 98%   Abdomen: Soft mild diffuse tenderness well-healed midline laparotomy and groin incisions no hernia  Extremities: 2+ femoral and dorsalis pedis pulses bilaterally  Assessment: Continuing to improve status post aortobifemoral bypass no claudication symptoms currently  Plan: Return to normal activities followup in one year for CT scan abdomen and pelvis for baseline assessment of graft continued to refrain from smoking  Lindsay Bruns, MD [facility] [place] Office: [phone] Pager: [phone]

## 2012-08-06 NOTE — Addendum Note (Signed)
Addended by: Adria Dill L on: 08/06/2012 03:49 PM   Modules accepted: Orders

## 2012-08-17 ENCOUNTER — Telehealth: Payer: Self-pay | Admitting: *Deleted

## 2012-08-17 DIAGNOSIS — R079 Chest pain, unspecified: Secondary | ICD-10-CM

## 2012-08-17 NOTE — Telephone Encounter (Signed)
Patient called to report that she had a "knot in her abdomen" last week and since then she has had increased RLE pain. She states that she now has pain between her shoulder blades, chest pain and decreased O2 sat.  I advised her to go to her local ED immediately for further workup to rule out PE (She had an aortobifemoral BPG by Dr. Darrick Penna on 06-17-12). She said that she would have her husband drive her to [facility] asap.

## 2012-11-26 ENCOUNTER — Encounter: Payer: 59 | Admitting: Gynecology

## 2013-02-18 ENCOUNTER — Other Ambulatory Visit: Payer: Self-pay

## 2013-08-11 ENCOUNTER — Encounter: Payer: Self-pay | Admitting: Vascular Surgery

## 2013-08-12 ENCOUNTER — Ambulatory Visit (INDEPENDENT_AMBULATORY_CARE_PROVIDER_SITE_OTHER): Payer: Medicare PPO | Admitting: Vascular Surgery

## 2013-08-12 ENCOUNTER — Ambulatory Visit
Admission: RE | Admit: 2013-08-12 | Discharge: 2013-08-12 | Disposition: A | Discharge status: Home / Self Care | Payer: Medicare PPO | Source: Ambulatory Visit | Attending: Vascular Surgery | Admitting: Vascular Surgery

## 2013-08-12 ENCOUNTER — Encounter: Payer: Self-pay | Admitting: Vascular Surgery

## 2013-08-12 ENCOUNTER — Encounter (INDEPENDENT_AMBULATORY_CARE_PROVIDER_SITE_OTHER): Payer: Self-pay

## 2013-08-12 VITALS — BP 145/67 | HR 84 | Ht 65.0 in | Wt 149.0 lb

## 2013-08-12 DIAGNOSIS — I739 Peripheral vascular disease, unspecified: Secondary | ICD-10-CM

## 2013-08-12 DIAGNOSIS — Z48812 Encounter for surgical aftercare following surgery on the circulatory system: Secondary | ICD-10-CM

## 2013-08-12 MED ORDER — IOHEXOL 350 MG/ML SOLN
80.0000 mL | Freq: Once | INTRAVENOUS | Status: AC | PRN
  Administered 2013-08-12: 80 mL via INTRAVENOUS

## 2013-08-12 NOTE — Addendum Note (Signed)
Addended by: Peter Minium K on: 08/12/2013 02:00 PM   Modules accepted: Orders

## 2013-08-12 NOTE — Progress Notes (Signed)
Patient is a 68 year old female who returns for followup today after aortobifemoral on June 17 2012. She also had an ovary removed by Dr. Phineas Real at that same operation. She still reports various muscle aches and pains across her abdomen especially in the subxiphoid region. She states she has various aches and pains in her legs as well. She also complains of swelling in her abdomen as well as her lower extremities. She also has problems with intermittent constipation. Her appetite is improving although still not quite back to baseline. She states her walking distance is significantly improved. I congratulated her today since she has now quit smoking for a year. She is still on home oxygen.  Review of systems: She has shortness of breath with exertion. She denies chest pain.  Physical exam:  Filed Vitals:   08/12/13 1055  BP: 145/67  Pulse: 84  Height: 5\' 5"  (1.651 m)  Weight: 149 lb (67.586 kg)  SpO2: 96%     Abdomen: Soft mild diffuse tenderness well-healed midline laparotomy and groin incisions no hernia worse area of pain is just below the xiphoid  Extremities: 2+ femoral and dorsalis pedis pulses bilaterally  Data: CT scan of abdomen and pelvis is reviewed today. The patient did have a very small less than 1 cm hernia adjacent to the xiphoid.  Assessment: Continuing to improve status post aortobifemoral bypass no claudication symptoms currently. Most likely her leg pains are due to deconditioning. I also explained to her that her abdominal swelling is not fluid but due to overall weight gain. I discussed with her several techniques were trying to lose weight. This included walking at least 30 minutes daily. She admits to being very sedentary over the last year. She also complained of her use of oxygen. I explained to her that this is due to underlying COPD.  Plan: Return to normal activities continue to refrain from smoking.  Improve adherence to exercise plan in walking. Would not  consider intervention for the small hernia unless expansion over time. Discussed with the patient using increase fiber in her diet as a method to improve and constipation symptoms. She will followup in 6 months time for repeat ABIs.  Ruta Hinds, MD [facility] Office: [phone] Pager: [phone]

## 2013-10-18 ENCOUNTER — Other Ambulatory Visit: Payer: Self-pay | Admitting: *Deleted

## 2014-02-14 ENCOUNTER — Encounter: Payer: Self-pay | Admitting: Vascular Surgery

## 2014-03-03 ENCOUNTER — Encounter (HOSPITAL_COMMUNITY): Payer: Medicare PPO

## 2014-03-03 ENCOUNTER — Ambulatory Visit: Payer: Medicare PPO | Admitting: Family

## 2014-05-05 ENCOUNTER — Encounter: Payer: Self-pay | Admitting: Family

## 2014-05-09 ENCOUNTER — Ambulatory Visit: Payer: Medicare PPO | Admitting: Family

## 2014-05-09 ENCOUNTER — Encounter (HOSPITAL_COMMUNITY): Payer: Medicare PPO

## 2014-07-13 ENCOUNTER — Encounter: Payer: Self-pay | Admitting: Family

## 2014-07-14 ENCOUNTER — Ambulatory Visit (HOSPITAL_COMMUNITY)
Admission: RE | Admit: 2014-07-14 | Discharge: 2014-07-14 | Disposition: A | Discharge status: Home / Self Care | Payer: Medicare HMO | Source: Ambulatory Visit | Attending: Family | Admitting: Family

## 2014-07-14 ENCOUNTER — Ambulatory Visit (INDEPENDENT_AMBULATORY_CARE_PROVIDER_SITE_OTHER): Payer: Medicare HMO | Admitting: Family

## 2014-07-14 ENCOUNTER — Encounter: Payer: Self-pay | Admitting: Family

## 2014-07-14 VITALS — BP 125/68 | HR 75 | Resp 16 | Ht 64.5 in | Wt 148.0 lb

## 2014-07-14 DIAGNOSIS — Z87891 Personal history of nicotine dependence: Secondary | ICD-10-CM | POA: Diagnosis not present

## 2014-07-14 DIAGNOSIS — IMO0001 Reserved for inherently not codable concepts without codable children: Secondary | ICD-10-CM

## 2014-07-14 DIAGNOSIS — R29898 Other symptoms and signs involving the musculoskeletal system: Secondary | ICD-10-CM | POA: Insufficient documentation

## 2014-07-14 DIAGNOSIS — Z9889 Other specified postprocedural states: Secondary | ICD-10-CM | POA: Diagnosis not present

## 2014-07-14 DIAGNOSIS — R143 Flatulence: Secondary | ICD-10-CM | POA: Diagnosis not present

## 2014-07-14 DIAGNOSIS — R1084 Generalized abdominal pain: Secondary | ICD-10-CM | POA: Diagnosis not present

## 2014-07-14 DIAGNOSIS — I739 Peripheral vascular disease, unspecified: Secondary | ICD-10-CM

## 2014-07-14 DIAGNOSIS — R202 Paresthesia of skin: Secondary | ICD-10-CM | POA: Insufficient documentation

## 2014-07-14 DIAGNOSIS — Z95828 Presence of other vascular implants and grafts: Secondary | ICD-10-CM

## 2014-07-14 DIAGNOSIS — R200 Anesthesia of skin: Secondary | ICD-10-CM | POA: Insufficient documentation

## 2014-07-14 DIAGNOSIS — M25569 Pain in unspecified knee: Secondary | ICD-10-CM | POA: Insufficient documentation

## 2014-07-14 NOTE — Progress Notes (Signed)
[facility] [place] HISTORY AND PHYSICAL -PAD  History of Present Illness Lindsay [name] is a 69 y.o. female patient of Dr. Oneida Alar who returns for followup today s/p aortobifemoral bypass, proximal anastomosis end to end with 12x 7 mm dacron graft, and bovine pericardium to cover retroperitoneum on June 17 2012; this was for lower extremity claudication.  She also had an ovary removed by Dr. Phineas Real at that same operation. She still reports various muscle aches and pains across her abdomen especially in the subxiphoid region. She states she has various aches and pains in her legs as well. She also complains of swelling in her abdomen as well as her lower extremities. She also has problems with intermittent constipation. Her appetite is improving although still not quite back to baseline. She states her walking distance is significantly improved. She is still on home oxygen.  Pt states her walking is limited by her dyspnea.  She denies non healing wounds.   CTabdomen and pelvis from April 2015: The patient did have a very small less than 1 cm hernia adjacent to the xiphoid. She reports chronic gas and constipation.   Pt reports she has been to the ED in [facility] and [place] 3x since her last visit with [place], shingles, gallstones, and heart murmur discovered per pt.  Most likely her leg pains are due to deconditioning. Dr. Oneida Alar had explained to her that her abdominal swelling is not fluid but due to overall weight gain and discussed with her several techniques were trying to lose weight. This included walking at least 30 minutes daily. She admits to being very sedentary over the last year. She also complained of her use of oxygen. Dr. Oneida Alar has explained to her that this is due to underlying COPD.   Dr. Oneida Alar progress note indicates would not consider intervention for the small hernia unless expansion occurs over time.   Pt Diabetic: No Pt smoker: former smoker,  quit in 2014  Pt meds include: Statin :Yes ASA: Yes Other anticoagulants/antiplatelets: no  Past Medical History  Diagnosis Date  . Abdominal aortic stenosis   . 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 H/O hiatal hernia   . Shortness of breath     pt states food gets stuck occasionally and she can't breath so has inhalerlSOB with exertion  . History of bronchitis     early 2014  . Pneumonia     hx of;last time in early 2013  . Headache(784.0)   . Vertigo   . Hand numbness     left hand  . Chronic back pain     low back  . Dry skin   . Esophageal reflux     takes Nexium daily  . Constipation   . Urinary incontinence   . Cataracts, bilateral     immature  . Depression     doesn't take any medication  . Anxiety     doesn't take any medications  . Insomnia     Social History History  Substance Use Topics  . Smoking status: Former Smoker -- 1.00 packs/day for 30 years    Types: Cigarettes    Quit date: 06/16/2012  . Smokeless tobacco: Never Used     Comment:    . Alcohol Use: No    Family History Family History  Problem Relation Age of Onset  . Heart attack Mother   . Diabetes Mother   .  Heart disease Mother   . Hyperlipidemia Mother   . Hypertension Mother   . Lung disease Father     Black Lung  . Heart disease Father     before age 70  . Hyperlipidemia Father   . Hypertension Father   . Heart attack Father   . Diabetes Sister   . Heart disease Sister     before age 28  . Hyperlipidemia Sister   . Hypertension Sister   . Heart attack Sister   . Diabetes Brother   . Heart disease Brother     before age 76  . Hypertension Brother   . Heart attack Brother     Past Surgical History  Procedure Laterality Date  . Abdominal hysterectomy      TAH RSO  . Hernia repair Left   . Eye surgery Left     x 7  . Carpal tunnel release Right   . Appendectomy    . Colonoscopy    . Esophagogastroduodenoscopy    . Aortic  endarterecetomy N/A 06/17/2012    Procedure: AORTIC ENDARTERECETOMY;  Surgeon: Elam Dutch, MD;  Location: [facility];  Service: Vascular;  Laterality: N/A;  . Oophorectomy      LSO    Allergies  Allergen Reactions  . Sulfa Antibiotics Anaphylaxis    Can not take ANYTHING with SULFA.  [place] Wash &Cleanser Anaphylaxis    Current Outpatient Prescriptions  Medication Sig Dispense Refill  . albuterol (PROVENTIL HFA;VENTOLIN HFA) 108 (90 BASE) MCG/ACT inhaler Inhale 2 puffs into the lungs every 6 (six) hours as needed for wheezing. For wheezing    . aspirin EC 81 MG tablet Take 81 mg by mouth daily.    Marland Kitchen atorvastatin (LIPITOR) 20 MG tablet Take 1 tablet (20 mg total) by mouth daily. 90 tablet 3  . esomeprazole (NEXIUM) 40 MG capsule Take 40 mg by mouth daily before breakfast.    . feeding supplement (ENSURE COMPLETE) LIQD Take 237 mLs by mouth 3 (three) times daily with meals.    . feeding supplement (ENSURE) PUDG Take 1 Container by mouth 3 (three) times daily with meals.    . senna-docusate (SENOKOT-S) 8.6-50 MG per tablet Take 1 tablet by mouth 2 (two) times daily.     No current facility-administered medications for this visit.    ROS: See HPI for pertinent positives and negatives.   Physical Examination  Filed Vitals:   07/14/14 1417  BP: 125/68  Pulse: 75  Resp: 16  Height: 5' 4.5" (1.638 m)  Weight: 148 lb (67.132 kg)  SpO2: 96%   Body mass index is 25.02 kg/(m^2).  General: A&O x 3, WDWN. Gait: normal Eyes: PERRLA. Pulmonary: CTAB, without wheezes , rales or rhonchi. Cardiac: regular Rythm , without detected murmur.         Carotid Bruits Right Left   Negative Negative  Aorta is not palpable. Radial pulses: 2+ palpable and =                           VASCULAR EXAM: Extremities without ischemic changes , without Gangrene; without open wounds.  LE Pulses Right Left       FEMORAL   Palpable, tender to palpation, no masses or lesions   Palpable, tender to palpation, no masses or lesion        POPLITEAL  not palpable   not palpable       POSTERIOR TIBIAL  not palpable   not palpable        DORSALIS PEDIS      ANTERIOR TIBIAL 1+ palpable  1+ palpable    Abdomen: soft, diffusely tender to palpation, no palpable masses. Skin: no rashes, no ulcers. Musculoskeletal: no muscle wasting or atrophy.  Neurologic: A&O X 3; Appropriate Affect ; SENSATION: normal; MOTOR FUNCTION:  moving all extremities equally, motor strength 5/5 throughout. Speech is fluent/normal.  CN 2-12 intact.    Non-Invasive Vascular Imaging: DATE: 07/14/2014 ABI: RIGHT 1.04 (06/19/12, 0.87), Waveforms: triphasic, TBI: 0.79;  LEFT 1.01 (0.71), Waveforms: triphasic, TBI: 0.64   ASSESSMENT: Lindsay [name] is a 69 y.o. female who is s/p aortobifemoral bypass, proximal anastomosis end to end with 12x 7 mm dacron graft, and bovine pericardium to cover retroperitoneum on June 17 2012; this was for lower extremity claudication. Her leg pains are not likely related to arterial occlusive disease since her ABI's are normal in both legs with all triphasic waveforms.  Her walking seems limited by dyspnea.   She has abdominal pain, chronic constipation and gas.  Face to face time with patient was 25 minutes. Over 50% of this time was spent on counseling and coordination of care.   PLAN:  I discussed in depth with the patient the nature of atherosclerosis, and emphasized the importance of maximal medical management including strict control of blood pressure, blood glucose, and lipid levels, obtaining regular exercise, and continued cessation of smoking.  The patient is aware that without maximal medical management the underlying atherosclerotic disease process will progress, limiting the benefit of any interventions.  Based on the patient's vascular studies and examination,  pt will return to clinic in 1 year with ABI's. She knows to call us if she has concerns.   The patient was given information about PAD including signs, symptoms, treatment, what symptoms should prompt the patient to seek immediate medical care, and risk reduction measures to take.  Clemon Chambers, RN, MSN, FNP-C [facility] Phone: [phone]  Clinic MD: Oneida Alar on call  07/14/2014 2:10 PM

## 2014-07-14 NOTE — Patient Instructions (Signed)

## 2014-07-15 NOTE — Addendum Note (Signed)
Addended by: Mena Goes on: 07/15/2014 03:49 PM   Modules accepted: Orders

## 2014-11-09 IMAGING — CR DG CHEST 1V PORT
1 series · 1 of 1 positions shown · non-contrast
Comparison: Portable exam 3489 hours compared to 06/19/2012 at 5953
hours

CLINICAL DATA: Central line placement

PORTABLE CHEST - 1 VIEW

[AP]
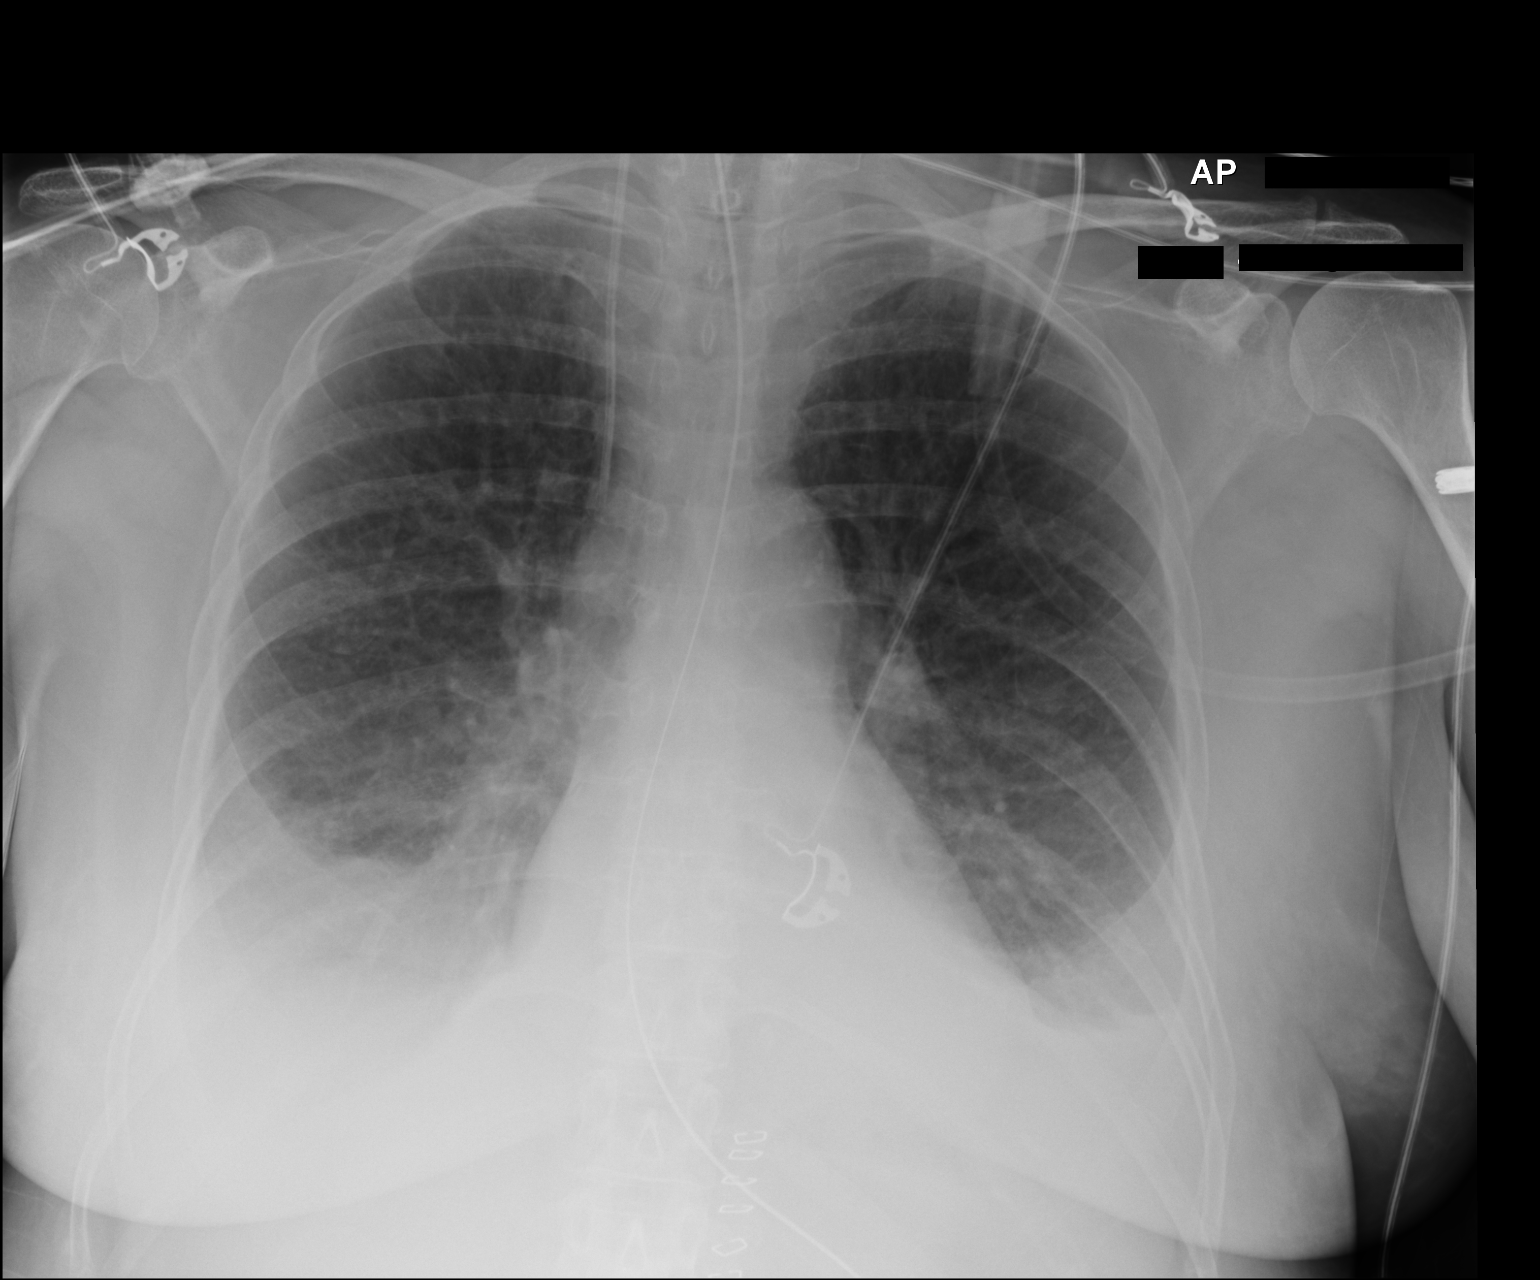

[1 of 1 positions shown; findings below may reference images not displayed]

FINDINGS: Nasogastric tube extends into stomach.
New right jugular central venous catheter with tip projecting over
proximal SVC.
Normal heart size and mediastinal contours.
Bibasilar effusions and atelectasis.
Upper lungs clear.
No pneumothorax.
IMPRESSION: No pneumothorax following right jugular line placement.
Persistent bibasilar effusions and atelectasis.

## 2014-11-09 IMAGING — CR DG CHEST 1V PORT
1 series · 1 of 1 positions shown · non-contrast
Comparison: 06/18/2012

CLINICAL DATA: Atherosclerosis. Aortic bypass graft.  PORTABLE
CHEST - 1 VIEW

[AP]
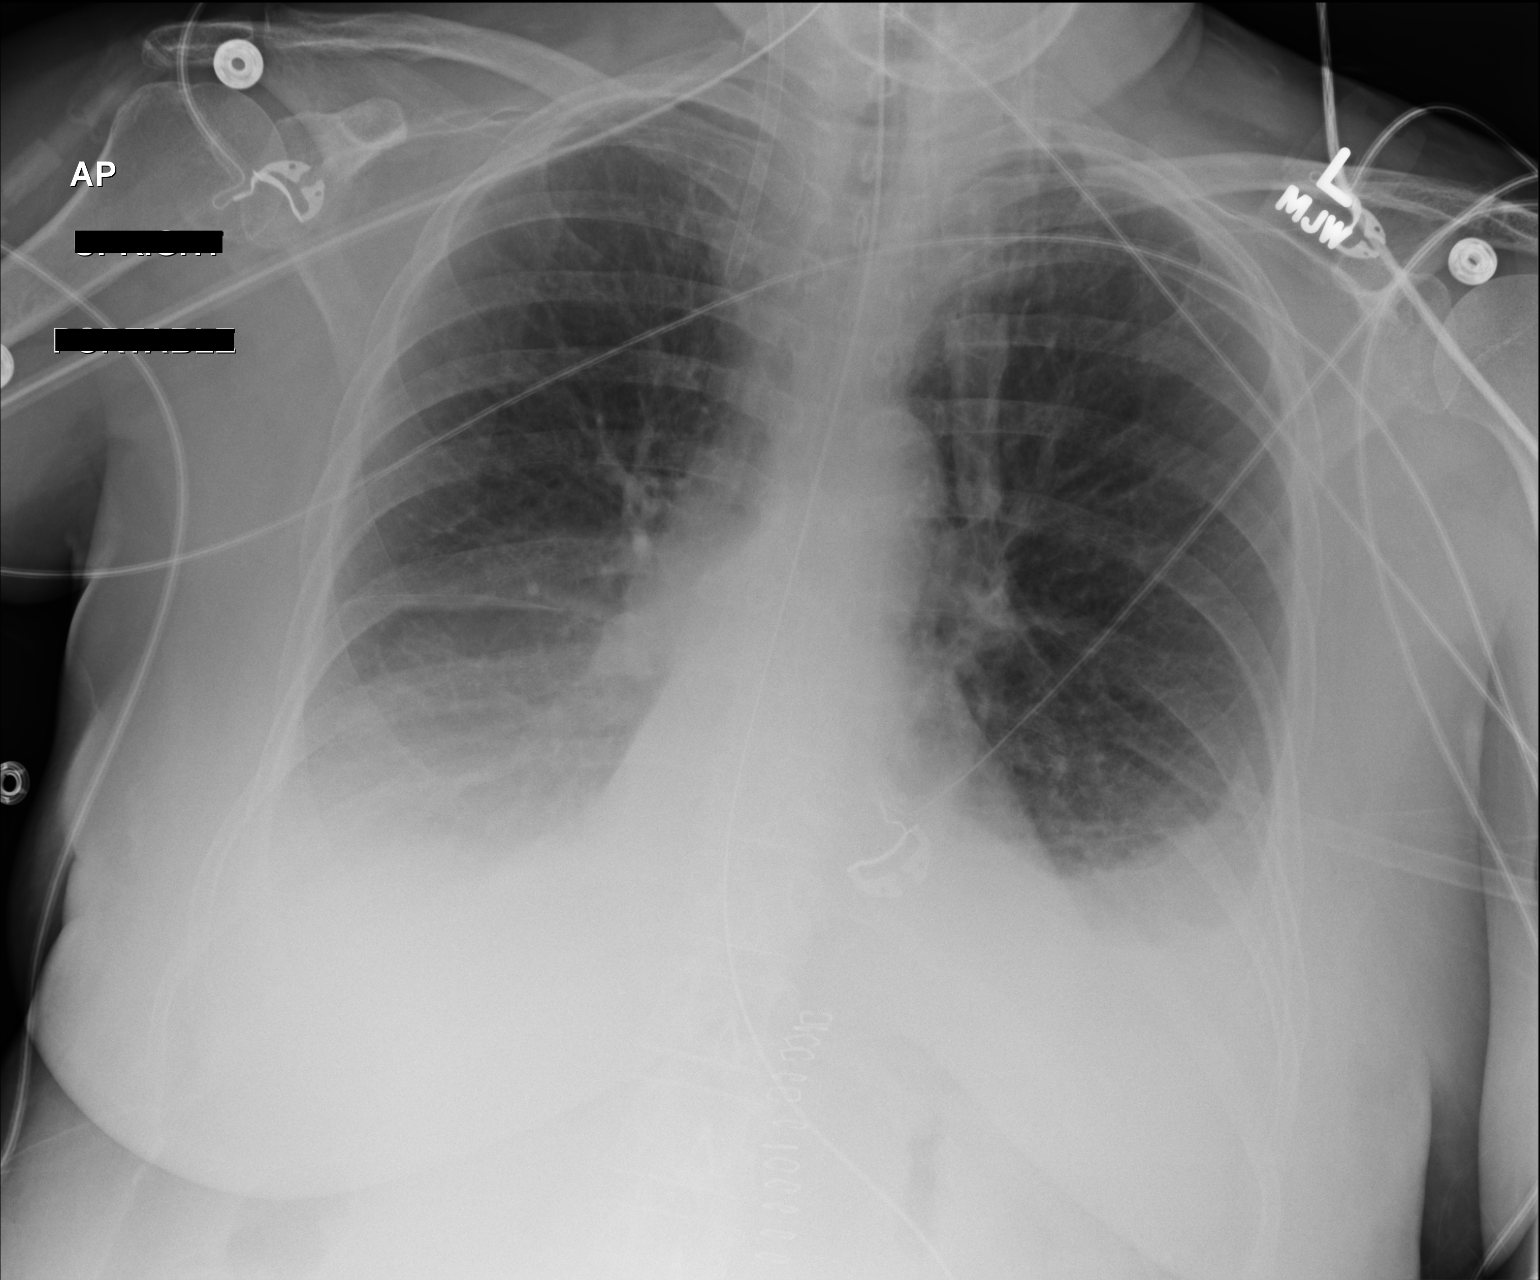

[1 of 1 positions shown; findings below may reference images not displayed]

FINDINGS: 1614 hours.  Endotracheal tube has been removed in the
interval.
The NG tube passes into the stomach although the distal tip
position is not included on the film.  Right IJ sheath again noted.
Increased bibasilar density most likely secondary to atelectasis.
Interval development of small bilateral pleural effusions.
Telemetry leads overlie the chest.
IMPRESSION: Interval extubation with increasing basilar atelectasis and new
small bilateral pleural effusions.

## 2014-11-15 IMAGING — CR DG CHEST 1V PORT
1 series · 1 of 1 positions shown · non-contrast
Comparison: 06/19/2012

CLINICAL DATA: Shortness of breath.

PORTABLE CHEST - 1 VIEW

[AP]
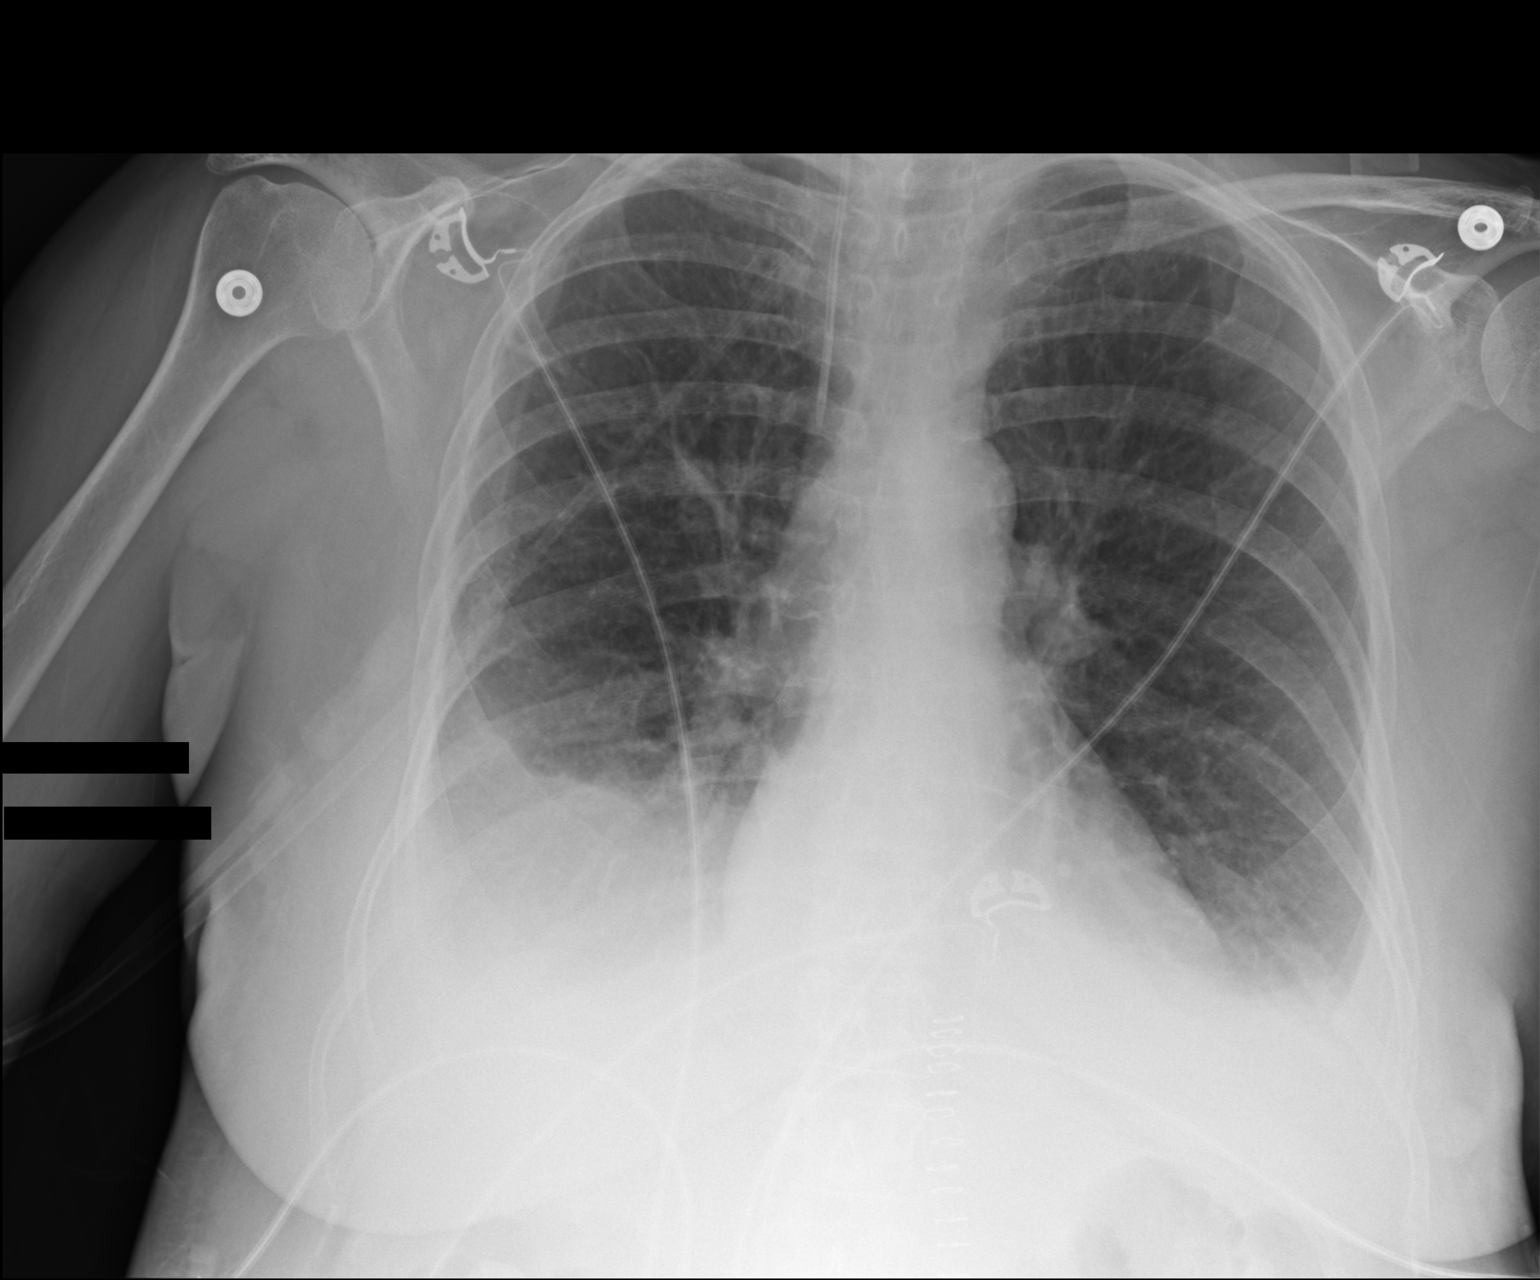

[1 of 1 positions shown; findings below may reference images not displayed]

FINDINGS: Bibasilar airspace opacities and effusions again noted.
Heart is normal size.  Right central line is unchanged.  No acute
bony abnormality.
IMPRESSION: No significant change bibasilar opacities and effusions.

## 2015-07-13 ENCOUNTER — Ambulatory Visit: Payer: Medicare HMO | Admitting: Family

## 2015-07-13 ENCOUNTER — Encounter (HOSPITAL_COMMUNITY): Payer: Medicare HMO

## 2015-07-14 ENCOUNTER — Encounter: Payer: Self-pay | Admitting: Family

## 2015-07-20 ENCOUNTER — Ambulatory Visit (INDEPENDENT_AMBULATORY_CARE_PROVIDER_SITE_OTHER): Payer: Medicare HMO | Admitting: Family

## 2015-07-20 ENCOUNTER — Encounter: Payer: Self-pay | Admitting: Family

## 2015-07-20 ENCOUNTER — Ambulatory Visit (HOSPITAL_COMMUNITY)
Admission: RE | Admit: 2015-07-20 | Discharge: 2015-07-20 | Disposition: A | Discharge status: Home / Self Care | Payer: Medicare HMO | Source: Ambulatory Visit | Attending: Family | Admitting: Family

## 2015-07-20 VITALS — BP 143/74 | HR 80 | Temp 98.2°F | Resp 14 | Ht 64.5 in | Wt 149.0 lb

## 2015-07-20 DIAGNOSIS — Z95828 Presence of other vascular implants and grafts: Secondary | ICD-10-CM | POA: Diagnosis not present

## 2015-07-20 DIAGNOSIS — Z87891 Personal history of nicotine dependence: Secondary | ICD-10-CM | POA: Diagnosis not present

## 2015-07-20 DIAGNOSIS — I70213 Atherosclerosis of native arteries of extremities with intermittent claudication, bilateral legs: Secondary | ICD-10-CM | POA: Diagnosis not present

## 2015-07-20 DIAGNOSIS — I739 Peripheral vascular disease, unspecified: Secondary | ICD-10-CM

## 2015-07-20 DIAGNOSIS — R29898 Other symptoms and signs involving the musculoskeletal system: Secondary | ICD-10-CM

## 2015-07-20 DIAGNOSIS — I498 Other specified cardiac arrhythmias: Secondary | ICD-10-CM | POA: Diagnosis not present

## 2015-07-20 NOTE — Patient Instructions (Signed)
Peripheral Vascular Disease Peripheral vascular disease (PVD) is a disease of the blood vessels that are not part of your heart and brain. A simple term for PVD is poor circulation. In most cases, PVD narrows the blood vessels that carry blood from your heart to the rest of your body. This can result in a decreased supply of blood to your arms, legs, and internal organs, like your stomach or kidneys. However, it most often affects a person's lower legs and feet. There are two types of PVD.  Organic PVD. This is the more common type. It is caused by damage to the structure of blood vessels.  Functional PVD. This is caused by conditions that make blood vessels contract and tighten (spasm). Without treatment, PVD tends to get worse over time. PVD can also lead to acute ischemic limb. This is when an arm or limb suddenly has trouble getting enough blood. This is a medical emergency. CAUSES Each type of PVD has many different causes. The most common cause of PVD is buildup of a fatty material (plaque) inside of your arteries (atherosclerosis). Small amounts of plaque can break off from the walls of the blood vessels and become lodged in a smaller artery. This blocks blood flow and can cause acute ischemic limb. Other common causes of PVD include:  Blood clots that form inside of blood vessels.  Injuries to blood vessels.  Diseases that cause inflammation of blood vessels or cause blood vessel spasms.  Health behaviors and health history that increase your risk of developing PVD. RISK FACTORS  You may have a greater risk of PVD if you:  Have a family history of PVD.  Have certain medical conditions, including:  High cholesterol.  Diabetes.  High blood pressure (hypertension).  Coronary heart disease.  Past problems with blood clots.  Past injury, such as burns or a broken bone. These may have damaged blood vessels in your limbs.  Buerger disease. This is caused by inflamed blood  vessels in your hands and feet.  Some forms of arthritis.  Rare birth defects that affect the arteries in your legs.  Use tobacco.  Do not get enough exercise.  Are obese.  Are age 50 or older. SIGNS AND SYMPTOMS  PVD may cause many different symptoms. Your symptoms depend on what part of your body is not getting enough blood. Some common signs and symptoms include:  Cramps in your lower legs. This may be a symptom of poor leg circulation (claudication).  Pain and weakness in your legs while you are physically active that goes away when you rest (intermittent claudication).  Leg pain when at rest.  Leg numbness, tingling, or weakness.  Coldness in a leg or foot, especially when compared with the other leg.  Skin or hair changes. These can include:  Hair loss.  Shiny skin.  Pale or bluish skin.  Thick toenails.  Inability to get or maintain an erection (erectile dysfunction). People with PVD are more prone to developing ulcers and sores on their toes, feet, or legs. These may take longer than normal to heal. DIAGNOSIS Your health care provider may diagnose PVD from your signs and symptoms. The health care provider will also do a physical exam. You may have tests to find out what is causing your PVD and determine its severity. Tests may include:  Blood pressure recordings from your arms and legs and measurements of the strength of your pulses (pulse volume recordings).  Imaging studies using sound waves to take pictures of   the blood flow through your blood vessels (Doppler ultrasound).  Injecting a dye into your blood vessels before having imaging studies using:  X-rays (angiogram or arteriogram).  Computer-generated X-rays (CT angiogram).  A powerful electromagnetic field and a computer (magnetic resonance angiogram or MRA). TREATMENT Treatment for PVD depends on the cause of your condition and the severity of your symptoms. It also depends on your age. Underlying  causes need to be treated and controlled. These include long-lasting (chronic) conditions, such as diabetes, high cholesterol, and high blood pressure. You may need to first try making lifestyle changes and taking medicines. Surgery may be needed if these do not work. Lifestyle changes may include:  Quitting smoking.  Exercising regularly.  Following a low-fat, low-cholesterol diet. Medicines may include:  Blood thinners to prevent blood clots.  Medicines to improve blood flow.  Medicines to improve your blood cholesterol levels. Surgical procedures may include:  A procedure that uses an inflated balloon to open a blocked artery and improve blood flow (angioplasty).  A procedure to put in a tube (stent) to keep a blocked artery open (stent implant).  Surgery to reroute blood flow around a blocked artery (peripheral bypass surgery).  Surgery to remove dead tissue from an infected wound on the affected limb.  Amputation. This is surgical removal of the affected limb. This may be necessary in cases of acute ischemic limb that are not improved through medical or surgical treatments. HOME CARE INSTRUCTIONS  Take medicines only as directed by your health care provider.  Do not use any tobacco products, including cigarettes, chewing tobacco, or electronic cigarettes. If you need help quitting, ask your health care provider.  Lose weight if you are overweight, and maintain a healthy weight as directed by your health care provider.  Eat a diet that is low in fat and cholesterol. If you need help, ask your health care provider.  Exercise regularly. Ask your health care provider to suggest some good activities for you.  Use compression stockings or other mechanical devices as directed by your health care provider.  Take good care of your feet.  Wear comfortable shoes that fit well.  Check your feet often for any cuts or sores. SEEK MEDICAL CARE IF:  You have cramps in your legs  while walking.  You have leg pain when you are at rest.  You have coldness in a leg or foot.  Your skin changes.  You have erectile dysfunction.  You have cuts or sores on your feet that are not healing. SEEK IMMEDIATE MEDICAL CARE IF:  Your arm or leg turns cold and blue.  Your arms or legs become red, warm, swollen, painful, or numb.  You have chest pain or trouble breathing.  You suddenly have weakness in your face, arm, or leg.  You become very confused or lose the ability to speak.  You suddenly have a very bad headache or lose your vision.   This information is not intended to replace advice given to you by your health care provider. Make sure you discuss any questions you have with your health care provider.   Document Released: 05/09/2004 Document Revised: 04/22/2014 Document Reviewed: 09/09/2013 Elsevier Interactive Patient Education 2016 [employer]  

## 2015-07-20 NOTE — Progress Notes (Addendum)
[facility]  HISTORY AND PHYSICAL -PAD  History of Present Illness Lindsay [name] is a 70 y.o. female  patient of Dr. Oneida Alar who returns for followup today s/p aortobifemoral bypass, proximal anastomosis end to end with 12x 7 mm dacron graft, and bovine pericardium to cover retroperitoneum on June 17 2012; this was for lower extremity claudication. Pt states her back pain resolved after this surgery.  She also had an ovary removed by Dr. Phineas Real at that same operation. She still reports various muscle aches and pains across her abdomen especially in the subxiphoid region. She states she has various aches and pains in her legs as well. She also complains of swelling in her abdomen as well as her lower extremities. She also has problems with intermittent constipation. Her appetite is improving although still not quite back to baseline. She states her walking distance is significantly improved. She is still on home oxygen.  Pt states her walking has been limited by her dyspnea.  She denies non healing wounds.   CTabdomen and pelvis from April 2015: The patient did have a very small less than 1 cm hernia adjacent to the xiphoid.  Dr. Oneida Alar progress note indicates would not consider intervention for the small hernia unless expansion occurs over time.   She reports chronic gas and constipation.   Pt reports she has been to the ED in [facility] and [place] 3x in 2016, shingles, gallstones, and heart murmur discovered per pt.  She has a feellng of her heart racing that started about mid 2016 and is triggerred with any activity.   Pt Diabetic: No Pt smoker: former smoker, quit in 2014  Pt meds include: Statin :Yes ASA: Yes    Past Medical History  Diagnosis Date  . Abdominal aortic stenosis   . 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 H/O hiatal hernia   . Shortness of breath     pt states food gets stuck  occasionally and she can't breath so has inhalerlSOB with exertion  . History of bronchitis     early 2014  . Pneumonia     hx of;last time in early 2013  . Headache(784.0)   . Vertigo   . Hand numbness     left hand  . Chronic back pain     low back  . Dry skin   . Esophageal reflux     takes Nexium daily  . Constipation   . Urinary incontinence   . Cataracts, bilateral     immature  . Depression     doesn't take any medication  . Anxiety     doesn't take any medications  . Insomnia     Social History Social History  Substance Use Topics  . Smoking status: Former Smoker -- 1.00 packs/day for 30 years    Types: Cigarettes    Quit date: 06/16/2012  . Smokeless tobacco: Never Used     Comment:    . Alcohol Use: No    Family History Family History  Problem Relation Age of Onset  . Heart attack Mother   . Diabetes Mother   . Heart disease Mother   . Hyperlipidemia Mother   . Hypertension Mother   . Lung disease Father     Black Lung  . Heart disease Father     before age 42  . Hyperlipidemia Father   . Hypertension Father   . Heart attack  Father   . Diabetes Sister   . Heart disease Sister     before age 71  . Hyperlipidemia Sister   . Hypertension Sister   . Heart attack Sister   . Diabetes Brother   . Heart disease Brother     before age 82  . Hypertension Brother   . Heart attack Brother     Past Surgical History  Procedure Laterality Date  . Abdominal hysterectomy      TAH RSO  . Hernia repair Left   . Eye surgery Left     x 7  . Carpal tunnel release Right   . Appendectomy    . Colonoscopy  2001    Pt had it 15 yrs ago  . Esophagogastroduodenoscopy    . Aortic endarterecetomy N/A 06/17/2012    Procedure: AORTIC ENDARTERECETOMY;  Surgeon: Elam Dutch, MD;  Location: [place];  Service: Vascular;  Laterality: N/A;  . Oophorectomy      LSO    Allergies  Allergen Reactions  . Sulfa Antibiotics Anaphylaxis    Can not take ANYTHING with  SULFA.  [place] Wash &Cleanser Anaphylaxis    Current Outpatient Prescriptions  Medication Sig Dispense Refill  . aspirin EC 81 MG tablet Take 81 mg by mouth daily.    Marland Kitchen atorvastatin (LIPITOR) 20 MG tablet Take 1 tablet (20 mg total) by mouth daily. 90 tablet 3  . esomeprazole (NEXIUM) 40 MG capsule Take 40 mg by mouth daily before breakfast.    . albuterol (PROVENTIL HFA;VENTOLIN HFA) 108 (90 BASE) MCG/ACT inhaler Inhale 2 puffs into the lungs every 6 (six) hours as needed for wheezing. Reported on 07/20/2015    . feeding supplement (ENSURE COMPLETE) LIQD Take 237 mLs by mouth 3 (three) times daily with meals. (Patient not taking: Reported on 07/14/2014)    . feeding supplement (ENSURE) PUDG Take 1 Container by mouth 3 (three) times daily with meals. (Patient not taking: Reported on 07/14/2014)    . senna-docusate (SENOKOT-S) 8.6-50 MG per tablet Take 1 tablet by mouth 2 (two) times daily. (Patient not taking: Reported on 07/14/2014)     No current facility-administered medications for this visit.    ROS: See HPI for pertinent positives and negatives.   Physical Examination  Filed Vitals:   07/20/15 1517  BP: 143/74  Pulse: 80  Temp: 98.2 F (36.8 C)  TempSrc: Oral  Resp: 14  Height: 5' 4.5" (1.638 m)  Weight: 149 lb (67.586 kg)  SpO2: 96%   Body mass index is 25.19 kg/(m^2).  General: A&O x 3, WDWN. Gait: normal Eyes: PERRLA. Pulmonary: CTAB, respirations are non labored at rest Cardiac: regular rhythm, no detected murmur.     Carotid Bruits Right Left   Negative Negative  Aorta is not palpable. Radial pulses: 2+ palpable and =   VASCULAR EXAM: Extremities without ischemic changes , without Gangrene; without open wounds.     LE Pulses Right Left   FEMORAL  Palpable,  tender to palpation, no masses or lesions  Palpable, tender to palpation, no masses or lesion    POPLITEAL not palpable  not palpable   POSTERIOR TIBIAL not palpable  not palpable    DORSALIS PEDIS  ANTERIOR TIBIAL 1+ palpable  1+ palpable    Abdomen: soft, diffusely tender to palpation, no palpable masses. Skin: no rashes, no ulcers. Musculoskeletal: no muscle wasting or atrophy. Neurologic: A&O X 3; Appropriate Affect ; SENSATION: normal; MOTOR FUNCTION: moving all extremities equally, motor strength 5/5  in upper extremities, 4/5 in lower extremities. Speech is fluent/normal.  CN 2-12 intact.         Non-Invasive Vascular Imaging: DATE: 07/20/2015 ABI: RIGHT: 1.06 (1.04, 07/14/14), Waveforms: triphasic;  LEFT: 0.96 (1.01), Waveforms: triphasic No significant change from prior exam on 07/14/14   ASSESSMENT: Lindsay [name] is a 71 y.o. female who is s/p aortobifemoral bypass, proximal anastomosis end to end with 12x 7 mm dacron graft, and bovine pericardium to cover retroperitoneum on June 17 2012; this was for lower extremity claudication. Her leg pains are not likely related to arterial occlusive disease since her ABI's are normal in both legs with all triphasic waveforms. No significant change from prior exam on 07/14/14.  She has intermittent bilateral weakness of her legs, more so in the left leg, since mid 2016; states this has remained stable. Consider referral to a neurologist or neurosurgeon for further evaluation of this, will defer to pt's PCP.  Pt states she was told that she needs c-spine surgery but has declined as she was told the surgery may cause paralysis; she currently has intermittent tingling and numbness in both hands, more so in the left; this has remained stable per pt.  Pt reports that intermittent "racing heart" started mid 2016 that is triggered by the least amount of activity, is accompanied by dyspnea; she will  sometimes have chest pain with this intermittent tachycardia "like my chest is going to explode", she denies radiating pain to neck/jaw/shoulders/arms.  I discussed with Dr. Oneida Alar pt HPI, CTA abd/pelvis result from 2015, ABI results and physical exam results from today.   PLAN:  Will refer back to Dr. Dorris Carnes, cardiologist who evaluated pt prior to her aortobifemoral bypass graft in 2014, to evaluate her intermittent tachycardia and associated sx's.   Based on the patient's vascular studies and examination, pt will return to clinic in 1 year with ABI's.   I discussed in depth with the patient the nature of atherosclerosis, and emphasized the importance of maximal medical management including strict control of blood pressure, blood glucose, and lipid levels, obtaining regular exercise, and continued cessation of smoking.  The patient is aware that without maximal medical management the underlying atherosclerotic disease process will progress, limiting the benefit of any interventions.  The patient was given information about PAD including signs, symptoms, treatment, what symptoms should prompt the patient to seek immediate medical care, and risk reduction measures to take.  Clemon Chambers, RN, MSN, FNP-C [facility] [employer] Phone: [phone]  Clinic MD: Oneida Alar  07/20/2015 3:38 PM

## 2015-07-21 NOTE — Addendum Note (Signed)
Addended by: Mena Goes on: 07/21/2015 02:22 PM   Modules accepted: Orders

## 2015-08-03 ENCOUNTER — Ambulatory Visit: Payer: Medicare HMO | Admitting: Internal Medicine

## 2015-08-14 ENCOUNTER — Other Ambulatory Visit: Payer: Self-pay | Admitting: *Deleted

## 2015-08-14 DIAGNOSIS — I739 Peripheral vascular disease, unspecified: Secondary | ICD-10-CM

## 2015-12-07 ENCOUNTER — Ambulatory Visit: Payer: Medicare HMO | Admitting: Internal Medicine

## 2016-03-12 ENCOUNTER — Encounter: Payer: Self-pay | Admitting: Internal Medicine

## 2016-03-17 NOTE — Progress Notes (Signed)
Cardiology Office Note   Date:  03/18/2016   ID:  LISLE DINWIDDIE, DOB 1946-03-17, MRN [id number]  PCP:  Lanier Clam, MD  Cardiologist:   Dorris Carnes, MD    F?U of CP    History of Present Illness: Lindsay [name] is a 70 y.o. female with a history of PVOD  I saw her in Feb 2014  Lexiscan myovue done    Does OK  Gets SOB if stoops or walk fast or if lies flat on back  Gotten better over past year  Pt hurts constently in mid sternum  Sore all the time    She has a small hernia around surgicula site in epigastrum     Outpatient Medications Prior to Visit  Medication Sig Dispense Refill  . aspirin EC 81 MG tablet Take 81 mg by mouth daily.    Marland Kitchen atorvastatin (LIPITOR) 20 MG tablet Take 1 tablet (20 mg total) by mouth daily. 90 tablet 3  . esomeprazole (NEXIUM) 40 MG capsule Take 40 mg by mouth daily before breakfast.    . albuterol (PROVENTIL HFA;VENTOLIN HFA) 108 (90 BASE) MCG/ACT inhaler Inhale 2 puffs into the lungs every 6 (six) hours as needed for wheezing. Reported on 07/20/2015    . feeding supplement (ENSURE COMPLETE) LIQD Take 237 mLs by mouth 3 (three) times daily with meals. (Patient not taking: Reported on 03/18/2016)    . feeding supplement (ENSURE) PUDG Take 1 Container by mouth 3 (three) times daily with meals. (Patient not taking: Reported on 03/18/2016)    . senna-docusate (SENOKOT-S) 8.6-50 MG per tablet Take 1 tablet by mouth 2 (two) times daily. (Patient not taking: Reported on 03/18/2016)     No facility-administered medications prior to visit.      Allergies:   Sulfa antibiotics and Sulfacet-sulfur wash &cleanser   Past Medical History:  Diagnosis Date  . Abdominal aortic stenosis   . Anxiety    doesn't take any medications  . Arthritis 03/16/12   Minimal Degenerative Disc Disease  L3-4 and L 4-5  . Cataracts, bilateral    immature  . Chronic back pain    low back  . Constipation   . Depression    doesn't take any medication  . Dry skin   .  Esophageal reflux    takes Nexium daily  . H/O hiatal hernia   . Hand numbness    left hand  . Headache(784.0)   . History of bronchitis    early 2014  . Insomnia   . Pneumonia    hx of;last time in early 2013  . Shortness of breath    pt states food gets stuck occasionally and she can't breath so has inhalerlSOB with exertion  . Urinary incontinence   . Vertigo     Past Surgical History:  Procedure Laterality Date  . ABDOMINAL HYSTERECTOMY     TAH RSO  . AORTIC ENDARTERECETOMY N/A 06/17/2012   Procedure: AORTIC ENDARTERECETOMY;  Surgeon: Elam Dutch, MD;  Location: [facility] OR;  Service: Vascular;  Laterality: N/A;  . APPENDECTOMY    . CARPAL TUNNEL RELEASE Right   . COLONOSCOPY  2001   Pt had it 15 yrs ago  . ESOPHAGOGASTRODUODENOSCOPY    . EYE SURGERY Left    x 7  . HERNIA REPAIR Left   . OOPHORECTOMY     LSO     Social History:  The patient  reports that she quit smoking about 3 years ago. Her smoking  use included Cigarettes. She has a 30.00 pack-year smoking history. She has never used smokeless tobacco. She reports that she does not drink alcohol or use drugs.   Family History:  The patient's family history includes Diabetes in her brother, mother, and sister; Heart attack in her brother, father, mother, and sister; Heart disease in her brother, father, mother, and sister; Hyperlipidemia in her father, mother, and sister; Hypertension in her brother, father, mother, and sister; Lung disease in her father.    ROS:  Please see the history of present illness. All other systems are reviewed and  Negative to the above problem except as noted.    PHYSICAL EXAM: VS:  BP 118/64   Pulse 68   Ht 5' 4.5" (1.638 m)   Wt 148 lb 1.9 oz (67.2 kg)   BMI 25.03 kg/m   GEN: Well nourished, well developed, in no acute distress HEENT: normal Neck: no JVD, carotid bruits, or masses Cardiac: RRR; no murmurs, rubs, or gallops,no edema  Chest  Tneder   Respiratory:  Some decreased  airflow   Clear   GI: soft, nontender, nondistended, + BS  No hepatomegaly  MS: no deformity Moving all extremities   Skin: warm and dry, no rash Neuro:  Strength and sensation are intact Psych: euthymic mood, full affect   EKG:  EKG is  ordered today.  SR 68 bpm    Lipid Panel    Component Value Date/Time   CHOL 240 (H) 06/08/2012 1018   TRIG 428.0 (H) 06/08/2012 1018   HDL 24.50 (L) 06/08/2012 1018   CHOLHDL 10 06/08/2012 1018   VLDL 85.6 (H) 06/08/2012 1018   LDLDIRECT 101.1 06/08/2012 1018      Wt Readings from Last 3 Encounters:  03/18/16 148 lb 1.9 oz (67.2 kg)  07/20/15 149 lb (67.6 kg)  07/14/14 148 lb (67.1 kg)      ASSESSMENT AND PLAN: 1  CP  I do nt think cardiac  Chest wall  2 SOB  SOme decreased airflow on exam  Volume is not bad  I would get echo to look at systolic / diastolic properies  3 PVOD    Keep on statin    4  HL  Continue on lipitor  LDL was 79   Previously 60  Keep on statin     F/U in July 2018     Current medicines are reviewed at length with the patient today.  The patient does not have concerns regarding medicines.  Signed, Dorris Carnes, MD  03/18/2016 11:39 AM    [address], [place], [place]  [id number] Phone: [phone]; Fax: [phone]

## 2016-03-18 ENCOUNTER — Encounter: Payer: Self-pay | Admitting: Internal Medicine

## 2016-03-18 ENCOUNTER — Encounter (INDEPENDENT_AMBULATORY_CARE_PROVIDER_SITE_OTHER): Payer: Self-pay

## 2016-03-18 ENCOUNTER — Ambulatory Visit (INDEPENDENT_AMBULATORY_CARE_PROVIDER_SITE_OTHER): Payer: Medicare HMO | Admitting: Internal Medicine

## 2016-03-18 VITALS — BP 118/64 | HR 68 | Ht 64.5 in | Wt 148.1 lb

## 2016-03-18 DIAGNOSIS — R0602 Shortness of breath: Secondary | ICD-10-CM

## 2016-03-18 NOTE — Patient Instructions (Signed)
Your physician recommends that you continue on your current medications as directed. Please refer to the Current Medication list given to you today.  Your physician has requested that you have an echocardiogram. Echocardiography is a painless test that uses sound waves to create images of your heart. It provides your doctor with information about the size and shape of your heart and how well your heart's chambers and valves are working. This procedure takes approximately one hour. There are no restrictions for this procedure.  Your physician wants you to follow-up in: July/Aug 2018 with Dr. Harrington Challenger.  You will receive a reminder letter in the mail two months in advance. If you don't receive a letter, please call our office to schedule the follow-up appointment.

## 2016-04-09 ENCOUNTER — Other Ambulatory Visit: Payer: Self-pay

## 2016-04-09 ENCOUNTER — Ambulatory Visit (HOSPITAL_COMMUNITY): Payer: Medicare HMO | Attending: Internal Medicine

## 2016-04-09 DIAGNOSIS — Z87891 Personal history of nicotine dependence: Secondary | ICD-10-CM | POA: Insufficient documentation

## 2016-04-09 DIAGNOSIS — E785 Hyperlipidemia, unspecified: Secondary | ICD-10-CM | POA: Diagnosis not present

## 2016-04-09 DIAGNOSIS — I071 Rheumatic tricuspid insufficiency: Secondary | ICD-10-CM | POA: Diagnosis not present

## 2016-04-09 DIAGNOSIS — R0602 Shortness of breath: Secondary | ICD-10-CM

## 2016-04-10 ENCOUNTER — Telehealth: Payer: Self-pay | Admitting: *Deleted

## 2016-04-10 DIAGNOSIS — R0602 Shortness of breath: Secondary | ICD-10-CM

## 2016-04-10 NOTE — Telephone Encounter (Signed)
-----   Message from Fay Records, MD sent at 04/09/2016 10:44 PM EST ----- Pumping funciton of heart normal   No sigificant valvular abnormalities Echo sugg that fluid may be up some  Would recomm checking labs (CBC, BMET, TSH and BNP)

## 2016-04-10 NOTE — Telephone Encounter (Signed)
Pt has been made aware of her lab results. She will come to office tomorrow, 04/11/16 for bmet, cbc, tsh, & bnp. Orders in EPIC.

## 2016-04-11 ENCOUNTER — Other Ambulatory Visit: Payer: Medicare HMO | Admitting: *Deleted

## 2016-04-11 DIAGNOSIS — R0602 Shortness of breath: Secondary | ICD-10-CM

## 2016-04-11 DIAGNOSIS — G903 Multi-system degeneration of the autonomic nervous system: Secondary | ICD-10-CM

## 2016-04-11 LAB — CBC WITH DIFFERENTIAL/PLATELET
BASOS PCT: 1 %
Basophils Absolute: 77 cells/uL (ref 0–200)
EOS ABS: 154 {cells}/uL (ref 15–500)
EOS PCT: 2 %
HCT: 41.6 % (ref 35.0–45.0)
Hemoglobin: 13.6 g/dL (ref 11.7–15.5)
LYMPHS PCT: 30 %
Lymphs Abs: 2310 cells/uL (ref 850–3900)
MCH: 26.4 pg — ABNORMAL LOW (ref 27.0–33.0)
MCHC: 32.7 g/dL (ref 32.0–36.0)
MCV: 80.6 fL (ref 80.0–100.0)
MONOS PCT: 9 %
MPV: 9.1 fL (ref 7.5–12.5)
Monocytes Absolute: 693 cells/uL (ref 200–950)
NEUTROS ABS: 4466 {cells}/uL (ref 1500–7800)
Neutrophils Relative %: 58 %
PLATELETS: 278 10*3/uL (ref 140–400)
RBC: 5.16 MIL/uL — AB (ref 3.80–5.10)
RDW: 14.2 % (ref 11.0–15.0)
WBC: 7.7 10*3/uL (ref 3.8–10.8)

## 2016-04-11 LAB — BASIC METABOLIC PANEL
BUN: 17 mg/dL (ref 7–25)
CALCIUM: 9.2 mg/dL (ref 8.6–10.4)
CHLORIDE: 105 mmol/L (ref 98–110)
CO2: 28 mmol/L (ref 20–31)
Creat: 0.97 mg/dL — ABNORMAL HIGH (ref 0.60–0.93)
Glucose, Bld: 142 mg/dL — ABNORMAL HIGH (ref 65–99)
Potassium: 4.2 mmol/L (ref 3.5–5.3)
Sodium: 141 mmol/L (ref 135–146)

## 2016-04-11 LAB — BRAIN NATRIURETIC PEPTIDE: BRAIN NATRIURETIC PEPTIDE: 21.7 pg/mL (ref <100)

## 2016-04-11 NOTE — Progress Notes (Signed)
Bmp

## 2016-04-11 NOTE — Addendum Note (Signed)
Addended by: Eulis Foster on: 04/11/2016 10:31 AM   Modules accepted: Orders

## 2016-04-12 LAB — TSH: TSH: 1.55 m[IU]/L

## 2016-04-17 ENCOUNTER — Telehealth: Payer: Self-pay | Admitting: Internal Medicine

## 2016-04-17 MED ORDER — POTASSIUM CHLORIDE CRYS ER 20 MEQ PO TBCR: EXTENDED_RELEASE_TABLET | ORAL | 6 refills | Status: DC

## 2016-04-17 MED ORDER — FUROSEMIDE 40 MG PO TABS: ORAL_TABLET | ORAL | 6 refills | Status: DC

## 2016-04-17 NOTE — Telephone Encounter (Signed)
Patient informed of lab results and recommendation to take lasix and potassium.  She will call in about 3 weeks to let us know if symptoms improve.

## 2016-04-17 NOTE — Telephone Encounter (Signed)
-----   Message from Fay Records, MD sent at 04/12/2016  9:37 PM EST ----- Thyod function is normal   CBC is OK   Electrolytes are OK Could try lasix 40 mg with 20 KCL a couple times per week (say Tues/Thrus)   See if symtoms improve

## 2016-04-17 NOTE — Telephone Encounter (Signed)
New Message  Pt voiced she had labs and no one has called her back with results and she is following up.  Please f/u with pt

## 2016-06-21 ENCOUNTER — Telehealth: Payer: Self-pay | Admitting: Family

## 2016-06-21 NOTE — Telephone Encounter (Signed)
Pt's husband calling back due to a VM he received from our office regarding the NP provider being out of the office on 08/15/16. We rescheduled the patient's lab and NP appointments to the same date of 08/23/16 due to them coming from [place]. awt

## 2016-08-15 ENCOUNTER — Ambulatory Visit: Payer: Medicare HMO | Admitting: Family

## 2016-08-15 ENCOUNTER — Encounter: Payer: Self-pay | Admitting: Family

## 2016-08-15 ENCOUNTER — Encounter (HOSPITAL_COMMUNITY): Payer: Medicare HMO

## 2016-08-21 ENCOUNTER — Ambulatory Visit: Payer: Medicare HMO | Admitting: Family

## 2016-08-23 ENCOUNTER — Ambulatory Visit (INDEPENDENT_AMBULATORY_CARE_PROVIDER_SITE_OTHER): Payer: Medicare HMO | Admitting: Family

## 2016-08-23 ENCOUNTER — Ambulatory Visit (HOSPITAL_COMMUNITY)
Admission: RE | Admit: 2016-08-23 | Discharge: 2016-08-23 | Disposition: A | Discharge status: Home / Self Care | Payer: Medicare HMO | Source: Ambulatory Visit | Attending: Family | Admitting: Family

## 2016-08-23 ENCOUNTER — Encounter: Payer: Self-pay | Admitting: Family

## 2016-08-23 VITALS — BP 134/70 | HR 81 | Resp 16 | Ht 65.0 in | Wt 143.0 lb

## 2016-08-23 DIAGNOSIS — Z87891 Personal history of nicotine dependence: Secondary | ICD-10-CM

## 2016-08-23 DIAGNOSIS — I70213 Atherosclerosis of native arteries of extremities with intermittent claudication, bilateral legs: Secondary | ICD-10-CM | POA: Insufficient documentation

## 2016-08-23 DIAGNOSIS — I779 Disorder of arteries and arterioles, unspecified: Secondary | ICD-10-CM

## 2016-08-23 DIAGNOSIS — Z95828 Presence of other vascular implants and grafts: Secondary | ICD-10-CM | POA: Insufficient documentation

## 2016-08-23 DIAGNOSIS — R29898 Other symptoms and signs involving the musculoskeletal system: Secondary | ICD-10-CM

## 2016-08-23 NOTE — Patient Instructions (Signed)
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
## 2016-08-26 NOTE — Addendum Note (Signed)
Addended by: Lianne Cure A on: 08/26/2016 03:07 PM   Modules accepted: Orders

## 2016-10-03 ENCOUNTER — Encounter: Payer: Self-pay | Admitting: Internal Medicine

## 2016-10-19 NOTE — Progress Notes (Signed)
Cardiology Office Note   Date:  10/21/2016   ID:  Lindsay [name], DOB 08-Apr-1946, MRN [id number]  PCP:  Lanier Clam, MD  Cardiologist:   Dorris Carnes, MD    F?U of CP    History of Present Illness: Lindsay [name] is a 71 y.o. female with a history of PVOD _    I saw her in Dec 2017 for SOB with activity as well ahs chest discomfort  Echo in December LVEF normal  Labs done in dec  REcomm trial fo asix 40 a couple times per week    Pt says that since starting  lasix and K she feels better  Less pressure in chest when she  takes it  Takes on Tues and Sat    Still with some swelling    Pt will notice with activity will get heart racing and chest pressure    Outpatient Medications Prior to Visit  Medication Sig Dispense Refill  . aspirin EC 81 MG tablet Take 81 mg by mouth daily.    Marland Kitchen atorvastatin (LIPITOR) 20 MG tablet Take 1 tablet (20 mg total) by mouth daily. 90 tablet 3  . esomeprazole (NEXIUM) 40 MG capsule Take 40 mg by mouth daily before breakfast.    . furosemide (LASIX) 40 MG tablet Take 40 mg two times a week as directed 12 tablet 6  . NON FORMULARY Oxygen    . potassium chloride SA (K-DUR,KLOR-CON) 20 MEQ tablet Take 20 mEq two times a week on same days as furosemide as directed. 12 tablet 6   No facility-administered medications prior to visit.      Allergies:   Sulfa antibiotics and Sulfacet-sulfur wash &cleanser   Past Medical History:  Diagnosis Date  . Abdominal aortic stenosis   . Anxiety    doesn't take any medications  . Arthritis 03/16/12   Minimal Degenerative Disc Disease  L3-4 and L 4-5  . Cataracts, bilateral    immature  . Chronic back pain    low back  . Constipation   . Depression    doesn't take any medication  . Dry skin   . Esophageal reflux    takes Nexium daily  . H/O hiatal hernia   . Hand numbness    left hand  . Headache(784.0)   . History of bronchitis    early 2014  . Insomnia   . Pneumonia    hx of;last time in  early 2013  . Shortness of breath    pt states food gets stuck occasionally and she can't breath so has inhalerlSOB with exertion  . Urinary incontinence   . Vertigo     Past Surgical History:  Procedure Laterality Date  . ABDOMINAL HYSTERECTOMY     TAH RSO  . AORTIC ENDARTERECETOMY N/A 06/17/2012   Procedure: AORTIC ENDARTERECETOMY;  Surgeon: Elam Dutch, MD;  Location: [facility] OR;  Service: Vascular;  Laterality: N/A;  . APPENDECTOMY    . CARPAL TUNNEL RELEASE Right   . COLONOSCOPY  2001   Pt had it 15 yrs ago  . ESOPHAGOGASTRODUODENOSCOPY    . EYE SURGERY Left    x 7  . HERNIA REPAIR Left   . OOPHORECTOMY     LSO     Social History:  The patient  reports that she quit smoking about 4 years ago. Her smoking use included Cigarettes. She has a 30.00 pack-year smoking history. She has never used smokeless tobacco. She reports that she does not  drink alcohol or use drugs.   Family History:  The patient's family history includes Diabetes in her brother, mother, and sister; Heart attack in her brother, father, mother, and sister; Heart disease in her brother, father, mother, and sister; Hyperlipidemia in her father, mother, and sister; Hypertension in her brother, father, mother, and sister; Lung disease in her father.    ROS:  Please see the history of present illness. All other systems are reviewed and  Negative to the above problem except as noted.    PHYSICAL EXAM: VS:  BP 128/66   Pulse 89   Ht 5\' 5"  (1.651 m)   Wt 63 kg (139 lb)   SpO2 95%   BMI 23.13 kg/m   GEN: Well nourished, well developed, in no acute distress HEENT: normal Neck: no JVD, carotid bruits, or masses Cardiac: RRR; no murmurs, rubs, or gallops,no edema  Chest  Tneder   Respiratory:  Some decreased airflow   Clear   GI: soft, nontender, nondistended, + BS  No hepatomegaly  MS: no deformity Moving all extremities   Skin: warm and dry, no rash Neuro:  Strength and sensation are intact Psych: euthymic  mood, full affect   EKG:  EKG is  ordered today.  SR 68 bpm    Lipid Panel    Component Value Date/Time   CHOL 240 (H) 06/08/2012 1018   TRIG 428.0 (H) 06/08/2012 1018   HDL 24.50 (L) 06/08/2012 1018   CHOLHDL 10 06/08/2012 1018   VLDL 85.6 (H) 06/08/2012 1018   LDLDIRECT 101.1 06/08/2012 1018      Wt Readings from Last 3 Encounters:  10/21/16 63 kg (139 lb)  08/23/16 64.9 kg (143 lb)  03/18/16 67.2 kg (148 lb 1.9 oz)      ASSESSMENT AND PLAN: 1  CP  Pt still with chest pressure when does things  Doing better with lasix   I would though recomm Lexiscan myovue to r/o ischemia 2 SOB  Continue lasix Check labs  May increase frequency   3 PVOD   No symtoms of claudication    4  HL  Continue lipitor     F/U in July 2018     Current medicines are reviewed at length with the patient today.  The patient does not have concerns regarding medicines.  Signed, Dorris Carnes, MD  10/21/2016 11:34 AM    [address], [place], [place]  [address] Phone: [phone]; Fax: [phone]

## 2016-10-21 ENCOUNTER — Encounter: Payer: Self-pay | Admitting: Internal Medicine

## 2016-10-21 ENCOUNTER — Ambulatory Visit (INDEPENDENT_AMBULATORY_CARE_PROVIDER_SITE_OTHER): Payer: Medicare HMO | Admitting: Internal Medicine

## 2016-10-21 VITALS — BP 128/66 | HR 89 | Ht 65.0 in | Wt 139.0 lb

## 2016-10-21 DIAGNOSIS — I270 Primary pulmonary hypertension: Secondary | ICD-10-CM

## 2016-10-21 DIAGNOSIS — R0789 Other chest pain: Secondary | ICD-10-CM | POA: Diagnosis not present

## 2016-10-21 DIAGNOSIS — E782 Mixed hyperlipidemia: Secondary | ICD-10-CM

## 2016-10-21 DIAGNOSIS — R0602 Shortness of breath: Secondary | ICD-10-CM

## 2016-10-21 NOTE — Patient Instructions (Signed)
Your physician recommends that you continue on your current medications as directed. Please refer to the Current Medication list given to you today.  Your physician has requested that you have a lexiscan myoview. For further information please visit [email or web address]. Please follow instruction sheet, as given.  You have been referred to NUTRITION FOR LOW SODIUM/LOW CHOLESTEROL DIET.   Your physician recommends that you return for lab work today (BMET, CBC, LIPIDS, BNP)  Your physician wants you to follow-up in: [place].  You will receive a reminder letter in the mail two months in advance. If you don't receive a letter, please call our office to schedule the follow-up appointment.

## 2016-10-22 LAB — BASIC METABOLIC PANEL
BUN/Creatinine Ratio: 21 (ref 12–28)
BUN: 17 mg/dL (ref 8–27)
CALCIUM: 9.7 mg/dL (ref 8.7–10.3)
CO2: 24 mmol/L (ref 20–29)
CREATININE: 0.82 mg/dL (ref 0.57–1.00)
Chloride: 103 mmol/L (ref 96–106)
GFR calc Af Amer: 83 mL/min/{1.73_m2} (ref >59)
GFR calc non Af Amer: 72 mL/min/{1.73_m2} (ref >59)
GLUCOSE: 127 mg/dL — AB (ref 65–99)
Potassium: 4.9 mmol/L (ref 3.5–5.2)
Sodium: 144 mmol/L (ref 134–144)

## 2016-10-22 LAB — CBC
HEMOGLOBIN: 13 g/dL (ref 11.1–15.9)
Hematocrit: 40.1 % (ref 34.0–46.6)
MCH: 26.2 pg — ABNORMAL LOW (ref 26.6–33.0)
MCHC: 32.4 g/dL (ref 31.5–35.7)
MCV: 81 fL (ref 79–97)
Platelets: 290 10*3/uL (ref 150–379)
RBC: 4.96 x10E6/uL (ref 3.77–5.28)
RDW: 14.7 % (ref 12.3–15.4)
WBC: 7.6 10*3/uL (ref 3.4–10.8)

## 2016-10-22 LAB — LIPID PANEL
CHOLESTEROL TOTAL: 149 mg/dL (ref 100–199)
Chol/HDL Ratio: 5.1 ratio — ABNORMAL HIGH (ref 0.0–4.4)
HDL: 29 mg/dL — ABNORMAL LOW (ref >39)
LDL CALC: 74 mg/dL (ref 0–99)
TRIGLYCERIDES: 230 mg/dL — AB (ref 0–149)
VLDL CHOLESTEROL CAL: 46 mg/dL — AB (ref 5–40)

## 2016-10-22 LAB — PRO B NATRIURETIC PEPTIDE: NT-Pro BNP: 172 pg/mL (ref 0–301)

## 2016-10-24 ENCOUNTER — Encounter (HOSPITAL_COMMUNITY): Payer: Medicare HMO

## 2016-10-28 ENCOUNTER — Telehealth (HOSPITAL_COMMUNITY): Payer: Self-pay | Admitting: *Deleted

## 2016-10-28 NOTE — Telephone Encounter (Signed)
Left message on voicemail in reference to upcoming appointment scheduled for 10/31/16. Phone number given for a call back so details instructions can be given. Samantha Olivera, Ranae [place]

## 2016-10-29 ENCOUNTER — Telehealth (HOSPITAL_COMMUNITY): Payer: Self-pay | Admitting: Radiology

## 2016-10-29 NOTE — Telephone Encounter (Signed)
Patient given detailed instructions per Myocardial Perfusion Study Information Sheet for the test on 10/31/2016 at 9:45. Patient notified to arrive 15 minutes early and that it is imperative to arrive on time for appointment to keep from having the test rescheduled.  If you need to cancel or reschedule your appointment, please call the office within 24 hours of your appointment. . Patient verbalized understanding.EHK

## 2016-10-31 ENCOUNTER — Ambulatory Visit (HOSPITAL_COMMUNITY): Payer: Medicare HMO | Attending: Cardiology

## 2016-10-31 DIAGNOSIS — R002 Palpitations: Secondary | ICD-10-CM | POA: Diagnosis not present

## 2016-10-31 DIAGNOSIS — R0602 Shortness of breath: Secondary | ICD-10-CM | POA: Diagnosis not present

## 2016-10-31 DIAGNOSIS — I270 Primary pulmonary hypertension: Secondary | ICD-10-CM

## 2016-10-31 DIAGNOSIS — R079 Chest pain, unspecified: Secondary | ICD-10-CM | POA: Insufficient documentation

## 2016-10-31 DIAGNOSIS — I2510 Atherosclerotic heart disease of native coronary artery without angina pectoris: Secondary | ICD-10-CM | POA: Diagnosis present

## 2016-10-31 LAB — MYOCARDIAL PERFUSION IMAGING
CHL CUP NUCLEAR SDS: 1
CHL CUP NUCLEAR SRS: 7
CSEPPHR: 116 {beats}/min
LHR: 0.25
LV dias vol: 41 mL (ref 46–106)
LV sys vol: 9 mL
Rest HR: 81 {beats}/min
SSS: 6
TID: 1.12

## 2016-10-31 MED ORDER — TECHNETIUM TC 99M TETROFOSMIN IV KIT
10.6000 | PACK | Freq: Once | INTRAVENOUS | Status: AC | PRN
  Administered 2016-10-31: 10.6 via INTRAVENOUS
  Filled 2016-10-31: qty 11

## 2016-10-31 MED ORDER — TECHNETIUM TC 99M TETROFOSMIN IV KIT
33.0000 | PACK | Freq: Once | INTRAVENOUS | Status: AC | PRN
  Administered 2016-10-31: 33 via INTRAVENOUS
  Filled 2016-10-31: qty 33

## 2016-10-31 MED ORDER — REGADENOSON 0.4 MG/5ML IV SOLN
0.4000 mg | Freq: Once | INTRAVENOUS | Status: AC
  Administered 2016-10-31: 0.4 mg via INTRAVENOUS

## 2017-05-06 ENCOUNTER — Telehealth: Payer: Self-pay | Admitting: Internal Medicine

## 2017-05-06 ENCOUNTER — Other Ambulatory Visit: Payer: Self-pay

## 2017-05-06 MED ORDER — FUROSEMIDE 40 MG PO TABS: ORAL_TABLET | ORAL | 0 refills | Status: DC

## 2017-05-06 NOTE — Telephone Encounter (Signed)
°*  STAT* If patient is at the pharmacy, call can be transferred to refill team.   1. Which medications need to be refilled? (please list name of each medication and dose if known) Furosemide 40 mg  2. Which pharmacy/location (including street and city if local pharmacy) is medication to be sent to? [place], [place]  3. Do they need a 30 day or 90 day supply? 90    Patient scheduled with Dr. Harrington Challenger 06-12-17 @12pm

## 2017-06-12 ENCOUNTER — Ambulatory Visit: Payer: Medicare HMO | Admitting: Internal Medicine

## 2017-06-12 ENCOUNTER — Encounter: Payer: Self-pay | Admitting: Internal Medicine

## 2017-06-12 VITALS — BP 110/58 | HR 70 | Ht 65.0 in | Wt 129.1 lb

## 2017-06-12 DIAGNOSIS — R0602 Shortness of breath: Secondary | ICD-10-CM | POA: Diagnosis not present

## 2017-06-12 DIAGNOSIS — I739 Peripheral vascular disease, unspecified: Secondary | ICD-10-CM | POA: Diagnosis not present

## 2017-06-12 DIAGNOSIS — I70213 Atherosclerosis of native arteries of extremities with intermittent claudication, bilateral legs: Secondary | ICD-10-CM | POA: Diagnosis not present

## 2017-06-12 DIAGNOSIS — R0789 Other chest pain: Secondary | ICD-10-CM

## 2017-06-12 DIAGNOSIS — E782 Mixed hyperlipidemia: Secondary | ICD-10-CM

## 2017-06-12 NOTE — Patient Instructions (Signed)
Your physician recommends that you continue on your current medications as directed. Please refer to the Current Medication list given to you today. Your physician recommends that you return for lab work in: TODAY (BMET, CBC, BNP, LIPID) Your physician wants you to follow-up in: January 2020 [place].  You will receive a reminder letter in the mail two months in advance. If you don't receive a letter, please call our office to schedule the follow-up appointment.

## 2017-06-12 NOTE — Progress Notes (Signed)
Cardiology Office Note   Date:  06/12/2017   ID:  Lindsay [name], DOB [birth year], MRN [id number]  PCP:  Lanier Clam, MD  Cardiologist:   Dorris Carnes, MD    F?U of CP and SOB   History of Present Illness: Lindsay [name] is a 72 y.o. female with a history of PVOD _    I saw her in Dec 2017 for SOB with activity as well as chest discomfort  Echo in December LVEF normal   Recom a trial of lasix    When  I saw her in July I recomm a myovue to r/o ischemia  This was normal    Since I saw her last summer  she has had an echo at [facility] in [place]  In September  This showed mild LVH  LVEF 65to 07%  Grade2 diastolic dysfunction   Pt says that she feels pretty good   Does get SOB with sweeping  Stable   Gong to start walking soon  She denies CP  Outpatient Medications Prior to Visit  Medication Sig Dispense Refill  . aspirin EC 81 MG tablet Take 81 mg by mouth daily.    Marland Kitchen atorvastatin (LIPITOR) 20 MG tablet Take 1 tablet (20 mg total) by mouth daily. 90 tablet 3  . furosemide (LASIX) 40 MG tablet Take 40 mg two times a week as directed 12 tablet 0  . gabapentin (NEURONTIN) 100 MG capsule Take 100 mg by mouth at bedtime.    . metFORMIN (GLUCOPHAGE) 500 MG tablet Take 500 mg by mouth 2 (two) times daily with a meal.     . mirtazapine (REMERON) 30 MG tablet Take 30 mg by mouth at bedtime.    . Multiple Vitamins-Minerals (ICAPS AREDS 2 PO) Take by mouth daily.    . NON FORMULARY Oxygen    . Omega-3 Fatty Acids (FISH OIL PO) Take by mouth daily.    Marland Kitchen omeprazole (PRILOSEC) 20 MG capsule Take 20 mg by mouth daily.    . potassium chloride SA (K-DUR,KLOR-CON) 20 MEQ tablet Take 20 mEq two times a week on same days as furosemide as directed. 12 tablet 6  . esomeprazole (NEXIUM) 40 MG capsule Take 40 mg by mouth daily before breakfast.     No facility-administered medications prior to visit.      Allergies:   Sulfa antibiotics and Sulfacet-sulfur wash &cleanser   Past Medical  History:  Diagnosis Date  . Abdominal aortic stenosis   . Anxiety    doesn't take any medications  . Arthritis 03/16/12   Minimal Degenerative Disc Disease  L3-4 and L 4-5  . Cataracts, bilateral    immature  . Chronic back pain    low back  . Constipation   . Depression    doesn't take any medication  . Dry skin   . Esophageal reflux    takes Nexium daily  . H/O hiatal hernia   . Hand numbness    left hand  . Headache(784.0)   . History of bronchitis    early 2014  . Insomnia   . Pneumonia    hx of;last time in early 2013  . Shortness of breath    pt states food gets stuck occasionally and she can't breath so has inhalerlSOB with exertion  . Urinary incontinence   . Vertigo     Past Surgical History:  Procedure Laterality Date  . ABDOMINAL HYSTERECTOMY     TAH RSO  . AORTIC ENDARTERECETOMY N/A  06/17/2012   Procedure: AORTIC ENDARTERECETOMY;  Surgeon: Elam Dutch, MD;  Location: [facility];  Service: Vascular;  Laterality: N/A;  . APPENDECTOMY    . CARPAL TUNNEL RELEASE Right   . COLONOSCOPY  2001   Pt had it 15 yrs ago  . ESOPHAGOGASTRODUODENOSCOPY    . EYE SURGERY Left    x 7  . HERNIA REPAIR Left   . OOPHORECTOMY     LSO     Social History:  The patient  reports that she quit smoking about 4 years ago. Her smoking use included cigarettes. She has a 30.00 pack-year smoking history. she has never used smokeless tobacco. She reports that she does not drink alcohol or use drugs.   Family History:  The patient's family history includes Diabetes in her brother, mother, and sister; Heart attack in her brother, father, mother, and sister; Heart disease in her brother, father, mother, and sister; Hyperlipidemia in her father, mother, and sister; Hypertension in her brother, father, mother, and sister; Lung disease in her father.    ROS:  Please see the history of present illness. All other systems are reviewed and  Negative to the above problem except as noted.     PHYSICAL EXAM: VS:  BP (!) 110/58   Pulse 70   Ht 5\' 5"  (1.651 m)   Wt 129 lb 1.9 oz (58.6 kg)   BMI 21.49 kg/m   GEN: Well nourished, well developed, in no acute distress HEENT: normal Neck: JVP normal  No carotid bruits, or masses Cardiac: RRR; no murmurs, rubs, or gallops,no edema  Respiratory:  Some decreased airflow   Clear   GI: soft, nontender, nondistended, + BS  No hepatomegaly  MS: no deformity Moving all extremities   Skin: warm and dry, no rash Neuro:  Strength and sensation are intact Psych: euthymic mood, full affect   EKG:  EKG is  ordered today.  SR 78 bpm    Lipid Panel    Component Value Date/Time   CHOL 149 10/21/2016 1222   TRIG 230 (H) 10/21/2016 1222   HDL 29 (L) 10/21/2016 1222   CHOLHDL 5.1 (H) 10/21/2016 1222   CHOLHDL 10 06/08/2012 1018   VLDL 85.6 (H) 06/08/2012 1018   LDLCALC 74 10/21/2016 1222   LDLDIRECT 101.1 06/08/2012 1018      Wt Readings from Last 3 Encounters:  06/12/17 129 lb 1.9 oz (58.6 kg)  10/21/16 139 lb (63 kg)  08/23/16 143 lb (64.9 kg)      ASSESSMENT AND PLAN: 1  CP  Denies    2 SOB  Pt with gr 2 diastolic dysfunction on echo  LV systolic function is vigorous  Discussed pathophys with her   SHe looks good now  Will watch  Check labs  I encouraged her to stay active   Check CBC and BNP    3  HL  Check lipids today    Check CBC since on ASA   F/U  Jan  2020   Current medicines are reviewed at length with the patient today.  The patient does not have concerns regarding medicines.  Signed, Dorris Carnes, MD  06/12/2017 12:08 PM    [address], [place], [place]  [address] Phone: [phone]; Fax: [phone]

## 2017-06-13 LAB — CBC
Hematocrit: 36.6 % (ref 34.0–46.6)
Hemoglobin: 12 g/dL (ref 11.1–15.9)
MCH: 26.2 pg — AB (ref 26.6–33.0)
MCHC: 32.8 g/dL (ref 31.5–35.7)
MCV: 80 fL (ref 79–97)
PLATELETS: 253 10*3/uL (ref 150–379)
RBC: 4.58 x10E6/uL (ref 3.77–5.28)
RDW: 14.6 % (ref 12.3–15.4)
WBC: 7.4 10*3/uL (ref 3.4–10.8)

## 2017-06-13 LAB — BASIC METABOLIC PANEL
BUN / CREAT RATIO: 16 (ref 12–28)
BUN: 12 mg/dL (ref 8–27)
CALCIUM: 9.4 mg/dL (ref 8.7–10.3)
CHLORIDE: 105 mmol/L (ref 96–106)
CO2: 25 mmol/L (ref 20–29)
Creatinine, Ser: 0.75 mg/dL (ref 0.57–1.00)
GFR calc non Af Amer: 79 mL/min/{1.73_m2} (ref >59)
GFR, EST AFRICAN AMERICAN: 91 mL/min/{1.73_m2} (ref >59)
Glucose: 104 mg/dL — ABNORMAL HIGH (ref 65–99)
POTASSIUM: 4.3 mmol/L (ref 3.5–5.2)
Sodium: 144 mmol/L (ref 134–144)

## 2017-06-13 LAB — PRO B NATRIURETIC PEPTIDE: NT-PRO BNP: 242 pg/mL (ref 0–301)

## 2017-06-13 LAB — LIPID PANEL
CHOL/HDL RATIO: 5.2 ratio — AB (ref 0.0–4.4)
Cholesterol, Total: 146 mg/dL (ref 100–199)
HDL: 28 mg/dL — ABNORMAL LOW (ref >39)
LDL Calculated: 41 mg/dL (ref 0–99)
Triglycerides: 385 mg/dL — ABNORMAL HIGH (ref 0–149)
VLDL Cholesterol Cal: 77 mg/dL — ABNORMAL HIGH (ref 5–40)

## 2017-06-23 ENCOUNTER — Other Ambulatory Visit: Payer: Self-pay | Admitting: *Deleted

## 2017-06-23 DIAGNOSIS — E781 Pure hyperglyceridemia: Secondary | ICD-10-CM

## 2017-07-01 ENCOUNTER — Other Ambulatory Visit: Payer: Self-pay | Admitting: Internal Medicine

## 2017-08-01 ENCOUNTER — Other Ambulatory Visit: Payer: Medicare HMO | Admitting: *Deleted

## 2017-08-01 ENCOUNTER — Encounter (INDEPENDENT_AMBULATORY_CARE_PROVIDER_SITE_OTHER): Payer: Self-pay

## 2017-08-01 DIAGNOSIS — E781 Pure hyperglyceridemia: Secondary | ICD-10-CM

## 2017-08-02 LAB — HEMOGLOBIN A1C
Est. average glucose Bld gHb Est-mCnc: 134 mg/dL
HEMOGLOBIN A1C: 6.3 % — AB (ref 4.8–5.6)

## 2017-08-02 LAB — LIPID PANEL
CHOLESTEROL TOTAL: 130 mg/dL (ref 100–199)
Chol/HDL Ratio: 3.9 ratio (ref 0.0–4.4)
HDL: 33 mg/dL — ABNORMAL LOW (ref >39)
LDL CALC: 65 mg/dL (ref 0–99)
Triglycerides: 158 mg/dL — ABNORMAL HIGH (ref 0–149)
VLDL Cholesterol Cal: 32 mg/dL (ref 5–40)

## 2017-08-28 ENCOUNTER — Encounter: Payer: Self-pay | Admitting: Family

## 2017-09-04 ENCOUNTER — Ambulatory Visit (HOSPITAL_COMMUNITY)
Admission: RE | Admit: 2017-09-04 | Discharge: 2017-09-04 | Disposition: A | Discharge status: Home / Self Care | Payer: Medicare HMO | Source: Ambulatory Visit | Attending: Family | Admitting: Family

## 2017-09-04 ENCOUNTER — Encounter: Payer: Self-pay | Admitting: Family

## 2017-09-04 ENCOUNTER — Ambulatory Visit (INDEPENDENT_AMBULATORY_CARE_PROVIDER_SITE_OTHER): Payer: Medicare HMO | Admitting: Family

## 2017-09-04 ENCOUNTER — Other Ambulatory Visit: Payer: Self-pay

## 2017-09-04 VITALS — BP 126/69 | HR 77 | Temp 98.0°F | Resp 14 | Ht 65.0 in | Wt 124.0 lb

## 2017-09-04 DIAGNOSIS — Z95828 Presence of other vascular implants and grafts: Secondary | ICD-10-CM | POA: Diagnosis not present

## 2017-09-04 DIAGNOSIS — E119 Type 2 diabetes mellitus without complications: Secondary | ICD-10-CM | POA: Insufficient documentation

## 2017-09-04 DIAGNOSIS — R0989 Other specified symptoms and signs involving the circulatory and respiratory systems: Secondary | ICD-10-CM | POA: Diagnosis present

## 2017-09-04 DIAGNOSIS — I779 Disorder of arteries and arterioles, unspecified: Secondary | ICD-10-CM | POA: Diagnosis not present

## 2017-09-04 DIAGNOSIS — R9389 Abnormal findings on diagnostic imaging of other specified body structures: Secondary | ICD-10-CM | POA: Insufficient documentation

## 2017-09-04 DIAGNOSIS — Z87891 Personal history of nicotine dependence: Secondary | ICD-10-CM

## 2017-09-04 DIAGNOSIS — I10 Essential (primary) hypertension: Secondary | ICD-10-CM | POA: Insufficient documentation
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
## 2017-09-04 NOTE — Patient Instructions (Signed)

## 2017-10-09 ENCOUNTER — Other Ambulatory Visit: Payer: Self-pay | Admitting: Internal Medicine

## 2017-10-13 ENCOUNTER — Other Ambulatory Visit: Payer: Self-pay

## 2017-10-13 MED ORDER — FUROSEMIDE 40 MG PO TABS: ORAL_TABLET | ORAL | 0 refills | Status: AC

## 2019-07-23 ENCOUNTER — Encounter: Payer: Self-pay | Admitting: Internal Medicine

## 2020-01-14 DEATH — deceased
